# Patient Record
Sex: Female | Born: 1965 | Race: White | Hispanic: No | Marital: Married | Smoking: Never smoker
Health system: Southern US, Community
[De-identification: ages and names within clinical notes are randomized; demographics above are authoritative.]

## PROBLEM LIST (undated history)

## (undated) DIAGNOSIS — F32A Depression, unspecified: Secondary | ICD-10-CM

## (undated) DIAGNOSIS — Z8489 Family history of other specified conditions: Secondary | ICD-10-CM

## (undated) DIAGNOSIS — E119 Type 2 diabetes mellitus without complications: Secondary | ICD-10-CM

## (undated) DIAGNOSIS — I152 Hypertension secondary to endocrine disorders: Secondary | ICD-10-CM

## (undated) DIAGNOSIS — E039 Hypothyroidism, unspecified: Secondary | ICD-10-CM

## (undated) DIAGNOSIS — F329 Major depressive disorder, single episode, unspecified: Secondary | ICD-10-CM

## (undated) DIAGNOSIS — F419 Anxiety disorder, unspecified: Secondary | ICD-10-CM

## (undated) DIAGNOSIS — E785 Hyperlipidemia, unspecified: Secondary | ICD-10-CM

## (undated) DIAGNOSIS — C801 Malignant (primary) neoplasm, unspecified: Secondary | ICD-10-CM

## (undated) DIAGNOSIS — E079 Disorder of thyroid, unspecified: Secondary | ICD-10-CM

## (undated) DIAGNOSIS — T7840XA Allergy, unspecified, initial encounter: Secondary | ICD-10-CM

## (undated) DIAGNOSIS — E1159 Type 2 diabetes mellitus with other circulatory complications: Secondary | ICD-10-CM

## (undated) HISTORY — DX: Depression, unspecified: F32.A

## (undated) HISTORY — PX: WRIST SURGERY: SHX841

## (undated) HISTORY — PX: BREAST LUMPECTOMY: SHX2

## (undated) HISTORY — PX: BREAST EXCISIONAL BIOPSY: SUR124

## (undated) HISTORY — DX: Major depressive disorder, single episode, unspecified: F32.9

## (undated) HISTORY — DX: Malignant (primary) neoplasm, unspecified: C80.1

## (undated) HISTORY — DX: Anxiety disorder, unspecified: F41.9

## (undated) HISTORY — PX: FRACTURE SURGERY: SHX138

## (undated) HISTORY — PX: BREAST BIOPSY: SHX20

## (undated) HISTORY — DX: Allergy, unspecified, initial encounter: T78.40XA

## (undated) HISTORY — DX: Disorder of thyroid, unspecified: E07.9

## (undated) HISTORY — PX: TUBAL LIGATION: SHX77

---

## 1989-04-09 HISTORY — PX: WRIST FRACTURE SURGERY: SHX121

## 1990-04-09 HISTORY — PX: HARDWARE REMOVAL: SHX979

## 1999-10-10 ENCOUNTER — Emergency Department (HOSPITAL_COMMUNITY): Admission: EM | Admit: 1999-10-10 | Discharge: 1999-10-10 | Payer: Self-pay | Admitting: Emergency Medicine

## 2000-04-09 HISTORY — PX: APPENDECTOMY: SHX54

## 2000-04-09 HISTORY — PX: ABDOMINAL HYSTERECTOMY: SHX81

## 2000-05-25 ENCOUNTER — Emergency Department (HOSPITAL_COMMUNITY): Admission: EM | Admit: 2000-05-25 | Discharge: 2000-05-25 | Payer: Self-pay | Admitting: Emergency Medicine

## 2001-06-28 ENCOUNTER — Emergency Department (HOSPITAL_COMMUNITY): Admission: EM | Admit: 2001-06-28 | Discharge: 2001-06-28 | Payer: Self-pay | Admitting: *Deleted

## 2004-02-23 ENCOUNTER — Ambulatory Visit: Payer: Self-pay | Admitting: Physician Assistant

## 2004-02-29 ENCOUNTER — Ambulatory Visit: Payer: Self-pay | Admitting: Pain Medicine

## 2004-03-09 ENCOUNTER — Ambulatory Visit: Payer: Self-pay | Admitting: Physician Assistant

## 2004-04-09 DIAGNOSIS — D0512 Intraductal carcinoma in situ of left breast: Secondary | ICD-10-CM

## 2004-04-09 HISTORY — DX: Intraductal carcinoma in situ of left breast: D05.12

## 2004-04-09 HISTORY — PX: BREAST LUMPECTOMY: SHX2

## 2004-04-09 HISTORY — PX: BREAST BIOPSY: SHX20

## 2004-05-09 ENCOUNTER — Ambulatory Visit: Payer: Self-pay | Admitting: Pain Medicine

## 2004-05-22 ENCOUNTER — Ambulatory Visit: Payer: Self-pay | Admitting: Physician Assistant

## 2004-05-25 ENCOUNTER — Ambulatory Visit: Payer: Self-pay | Admitting: Pain Medicine

## 2004-06-08 ENCOUNTER — Ambulatory Visit: Payer: Self-pay | Admitting: Pain Medicine

## 2004-06-20 ENCOUNTER — Ambulatory Visit: Payer: Self-pay | Admitting: Physician Assistant

## 2004-10-13 ENCOUNTER — Ambulatory Visit: Payer: Self-pay | Admitting: Physician Assistant

## 2004-10-17 ENCOUNTER — Ambulatory Visit: Payer: Self-pay | Admitting: Physician Assistant

## 2004-12-26 ENCOUNTER — Ambulatory Visit: Payer: Self-pay | Admitting: Pain Medicine

## 2005-01-09 ENCOUNTER — Ambulatory Visit: Payer: Self-pay | Admitting: Physician Assistant

## 2005-02-15 ENCOUNTER — Ambulatory Visit: Payer: Self-pay | Admitting: Physician Assistant

## 2005-06-11 ENCOUNTER — Ambulatory Visit: Payer: Self-pay | Admitting: Physician Assistant

## 2005-06-12 ENCOUNTER — Ambulatory Visit: Payer: Self-pay | Admitting: Pain Medicine

## 2005-06-13 ENCOUNTER — Emergency Department: Payer: Self-pay | Admitting: Emergency Medicine

## 2005-06-15 ENCOUNTER — Emergency Department: Payer: Self-pay | Admitting: Internal Medicine

## 2005-06-25 ENCOUNTER — Emergency Department: Payer: Self-pay | Admitting: Unknown Physician Specialty

## 2005-06-29 ENCOUNTER — Ambulatory Visit: Payer: Self-pay | Admitting: Physician Assistant

## 2005-07-05 ENCOUNTER — Ambulatory Visit: Payer: Self-pay | Admitting: Pain Medicine

## 2005-08-30 ENCOUNTER — Ambulatory Visit: Payer: Self-pay | Admitting: Unknown Physician Specialty

## 2005-09-12 ENCOUNTER — Ambulatory Visit: Payer: Self-pay | Admitting: Unknown Physician Specialty

## 2005-10-22 ENCOUNTER — Ambulatory Visit: Payer: Self-pay | Admitting: Physician Assistant

## 2006-01-17 ENCOUNTER — Ambulatory Visit: Payer: Self-pay | Admitting: Physician Assistant

## 2006-02-21 ENCOUNTER — Emergency Department: Payer: Self-pay | Admitting: Emergency Medicine

## 2006-04-16 ENCOUNTER — Ambulatory Visit: Payer: Self-pay | Admitting: Physician Assistant

## 2006-05-19 ENCOUNTER — Emergency Department: Payer: Self-pay | Admitting: Emergency Medicine

## 2006-07-15 ENCOUNTER — Ambulatory Visit: Payer: Self-pay | Admitting: Physician Assistant

## 2006-08-01 ENCOUNTER — Ambulatory Visit: Payer: Self-pay | Admitting: Pain Medicine

## 2006-10-02 ENCOUNTER — Ambulatory Visit: Payer: Self-pay | Admitting: Physician Assistant

## 2007-01-09 ENCOUNTER — Ambulatory Visit: Payer: Self-pay | Admitting: Physician Assistant

## 2007-04-23 ENCOUNTER — Ambulatory Visit: Payer: Self-pay | Admitting: Physician Assistant

## 2007-04-24 ENCOUNTER — Ambulatory Visit: Payer: Self-pay | Admitting: Pain Medicine

## 2007-05-21 ENCOUNTER — Ambulatory Visit: Payer: Self-pay | Admitting: Physician Assistant

## 2007-10-14 ENCOUNTER — Ambulatory Visit: Payer: Self-pay | Admitting: Pain Medicine

## 2008-01-15 ENCOUNTER — Ambulatory Visit: Payer: Self-pay | Admitting: Pain Medicine

## 2014-03-21 ENCOUNTER — Emergency Department: Payer: Self-pay | Admitting: Emergency Medicine

## 2014-10-14 ENCOUNTER — Encounter: Payer: Self-pay | Admitting: Family Medicine

## 2015-02-16 ENCOUNTER — Encounter: Payer: Self-pay | Admitting: Family Medicine

## 2015-02-16 ENCOUNTER — Ambulatory Visit (INDEPENDENT_AMBULATORY_CARE_PROVIDER_SITE_OTHER): Payer: BLUE CROSS/BLUE SHIELD | Admitting: Family Medicine

## 2015-02-16 VITALS — BP 118/76 | HR 82 | Temp 98.6°F | Resp 16 | Ht 64.5 in | Wt 194.6 lb

## 2015-02-16 DIAGNOSIS — Z8639 Personal history of other endocrine, nutritional and metabolic disease: Secondary | ICD-10-CM | POA: Insufficient documentation

## 2015-02-16 DIAGNOSIS — M7701 Medial epicondylitis, right elbow: Secondary | ICD-10-CM | POA: Diagnosis not present

## 2015-02-16 DIAGNOSIS — Z13220 Encounter for screening for lipoid disorders: Secondary | ICD-10-CM

## 2015-02-16 DIAGNOSIS — F32A Depression, unspecified: Secondary | ICD-10-CM | POA: Insufficient documentation

## 2015-02-16 DIAGNOSIS — F410 Panic disorder [episodic paroxysmal anxiety] without agoraphobia: Secondary | ICD-10-CM | POA: Insufficient documentation

## 2015-02-16 DIAGNOSIS — Z131 Encounter for screening for diabetes mellitus: Secondary | ICD-10-CM | POA: Diagnosis not present

## 2015-02-16 DIAGNOSIS — F329 Major depressive disorder, single episode, unspecified: Secondary | ICD-10-CM | POA: Insufficient documentation

## 2015-02-16 NOTE — Progress Notes (Signed)
Subjective:     Patient ID: Virginia Martin, female   DOB: [birth year], 49 y.o.   MRN: [id number]  HPI  Chief Complaint  Patient presents with  . New Patient (Initial Visit)    Patient comes into office today to establish patient care, she states she did want to address pain that she has had in her right elbow for the past couple months.   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 Has taken ibuprofen intermittently but not on a regular basis for her sx. Does not wish a flu shot as family members have gotten sick afterwards.   Review of Systems  Constitutional:       States she has not had screening labs in  > one year.  Genitourinary:       States she has not had recent f/u with [facility] regarding her DCIS.Encouraged to do so.       Objective:   Physical Exam  Constitutional: She appears well-developed and well-nourished. No distress.  Cardiovascular: Normal rate and regular rhythm.   Pulmonary/Chest: Breath sounds normal.  Musculoskeletal:  Right M.S.:EF/EE/WR/WE 5/5. Tender just distal to her medial epicondyle. FROM       Assessment:    1. Epicondylitis elbow, medial, right: patient defers orthopedic referral  2. Screening for cholesterol level - Lipid panel  3. Screening for diabetes mellitus - Renal function panel    Plan:    Discussed use of ibuprofen 400 mg. 3 x day with food and cold compresses.

## 2015-02-16 NOTE — Patient Instructions (Signed)
Discussed scheduling Two  Aleve twice daily with food and use of cold compress for 20 minutes at the end of your work day. We will call you with results of your lab tests.

## 2015-02-17 ENCOUNTER — Telehealth: Payer: Self-pay

## 2015-02-17 LAB — LIPID PANEL
Chol/HDL Ratio: 4.3 ratio units (ref 0.0–4.4)
Cholesterol, Total: 235 mg/dL — ABNORMAL HIGH (ref 100–199)
HDL: 55 mg/dL (ref >39)
LDL Calculated: 136 mg/dL — ABNORMAL HIGH (ref 0–99)
Triglycerides: 218 mg/dL — ABNORMAL HIGH (ref 0–149)
VLDL Cholesterol Cal: 44 mg/dL — ABNORMAL HIGH (ref 5–40)

## 2015-02-17 LAB — RENAL FUNCTION PANEL
ALBUMIN: 4.5 g/dL (ref 3.5–5.5)
BUN/Creatinine Ratio: 19 (ref 9–23)
BUN: 15 mg/dL (ref 6–24)
CHLORIDE: 100 mmol/L (ref 97–106)
CO2: 27 mmol/L (ref 18–29)
Calcium: 9.8 mg/dL (ref 8.7–10.2)
Creatinine, Ser: 0.8 mg/dL (ref 0.57–1.00)
GFR calc Af Amer: 101 mL/min/{1.73_m2} (ref >59)
GFR calc non Af Amer: 87 mL/min/{1.73_m2} (ref >59)
GLUCOSE: 99 mg/dL (ref 65–99)
PHOSPHORUS: 3.6 mg/dL (ref 2.5–4.5)
POTASSIUM: 5.2 mmol/L (ref 3.5–5.2)
Sodium: 141 mmol/L (ref 136–144)

## 2015-02-17 NOTE — Telephone Encounter (Signed)
-----   Message from Carmon Ginsberg, [place] sent at 02/17/2015  7:58 AM EST ----- Labs ok with mildly elevated cholesterol. Your calculated 10 year risk of developing cardiovascular disease is low at 1.1% at this time.

## 2015-02-17 NOTE — Telephone Encounter (Signed)
Patient has been advised, KW 

## 2015-02-18 ENCOUNTER — Other Ambulatory Visit: Payer: Self-pay

## 2015-03-01 ENCOUNTER — Other Ambulatory Visit: Payer: Self-pay | Admitting: Family Medicine

## 2015-03-01 DIAGNOSIS — F32A Depression, unspecified: Secondary | ICD-10-CM

## 2015-03-01 DIAGNOSIS — F329 Major depressive disorder, single episode, unspecified: Secondary | ICD-10-CM

## 2015-03-01 MED ORDER — LORAZEPAM 1 MG PO TABS: ORAL_TABLET | ORAL | Status: DC

## 2015-03-01 MED ORDER — VENLAFAXINE HCL ER 150 MG PO CP24: 150.0000 mg | ORAL_CAPSULE | Freq: Every day | ORAL | Status: DC

## 2015-03-01 NOTE — Telephone Encounter (Signed)
Patient states that she re-established with you as a patient and that she was previously going to [facility] who prescribed her medication, she is requesting that since you are now her PCP if you would refill her scripts? KW

## 2015-03-01 NOTE — Telephone Encounter (Signed)
Pt request to speak with [name] about her medications. Thanks TNP

## 2015-04-12 ENCOUNTER — Other Ambulatory Visit: Payer: Self-pay | Admitting: Family Medicine

## 2015-04-19 ENCOUNTER — Other Ambulatory Visit: Payer: Self-pay | Admitting: Family Medicine

## 2015-04-19 ENCOUNTER — Telehealth: Payer: Self-pay | Admitting: Family Medicine

## 2015-04-19 NOTE — Telephone Encounter (Signed)
Called in Rx as below. Patient notified.

## 2015-04-19 NOTE — Telephone Encounter (Signed)
Please call in lorazepam as ordered in the EMR on 04/12/15

## 2015-04-19 NOTE — Telephone Encounter (Signed)
Pt called for refill on LORazepam (ATIVAN) 1 MG tablet. It looks like it was printed on 04/12/15 but pt stated she hasn't picked up the RX and I didn't see it up front. Thanks TNP

## 2015-04-19 NOTE — Telephone Encounter (Signed)
Called [facility] and Joe (pharmacist) reports that patient last picked up Lorazepam on 03/13/15. They have no record of patient filling med in January.

## 2015-04-20 NOTE — Telephone Encounter (Signed)
RX called in at [facility]  

## 2015-05-16 ENCOUNTER — Ambulatory Visit (INDEPENDENT_AMBULATORY_CARE_PROVIDER_SITE_OTHER): Payer: BLUE CROSS/BLUE SHIELD | Admitting: Family Medicine

## 2015-05-16 ENCOUNTER — Encounter: Payer: Self-pay | Admitting: Family Medicine

## 2015-05-16 VITALS — BP 122/82 | HR 81 | Temp 97.7°F | Resp 16 | Wt 199.6 lb

## 2015-05-16 DIAGNOSIS — L03011 Cellulitis of right finger: Secondary | ICD-10-CM | POA: Diagnosis not present

## 2015-05-16 MED ORDER — CEFDINIR 300 MG PO CAPS: 300.0000 mg | ORAL_CAPSULE | Freq: Two times a day (BID) | ORAL | Status: DC

## 2015-05-16 NOTE — Progress Notes (Signed)
Subjective:     Patient ID: Virginia Martin, female   DOB: 1965/09/04, 50 y.o.   MRN: [id number]  HPI  Chief Complaint  Patient presents with  . Nail Problem    Patient comes in office today concerned about a possible nail infection on her right thumb. Patient reports that middle of last week around the 1st she had pulled off a piece of skin next to her nail. Since pulling skin off patient reports redness and drainage around nail, she has applied Neosporion and soaked in Epison salt.   Defers Keflex due to a memory of "bad taste". States throbbing less since using salt water soaks.   Review of Systems     Objective:   Physical Exam  Constitutional: She appears well-developed and well-nourished. No distress.  Skin:  Right thumb with inflammation at base and side of her nail. Mild tenderness to the touch. No drainage. Granulation tissue evident but no pointing.       Assessment:    1. Paronychia of right thumb - cefdinir (OMNICEF) 300 MG capsule; Take 1 capsule (300 mg total) by mouth 2 (two) times daily.  Dispense: 14 capsule; Refill: 0    Plan:    Continue salt water soaks.

## 2015-05-16 NOTE — Patient Instructions (Signed)
Continue Epsom salt soaks in warm water twice daily.

## 2015-07-01 ENCOUNTER — Ambulatory Visit: Payer: BLUE CROSS/BLUE SHIELD | Admitting: Family Medicine

## 2015-07-20 ENCOUNTER — Encounter: Payer: Self-pay | Admitting: *Deleted

## 2015-07-20 ENCOUNTER — Emergency Department
Admission: EM | Admit: 2015-07-20 | Discharge: 2015-07-20 | Disposition: A | Discharge status: Home / Self Care | Payer: BLUE CROSS/BLUE SHIELD | Attending: Emergency Medicine | Admitting: Emergency Medicine

## 2015-07-20 DIAGNOSIS — Z79899 Other long term (current) drug therapy: Secondary | ICD-10-CM | POA: Diagnosis not present

## 2015-07-20 DIAGNOSIS — F329 Major depressive disorder, single episode, unspecified: Secondary | ICD-10-CM | POA: Diagnosis not present

## 2015-07-20 DIAGNOSIS — L0231 Cutaneous abscess of buttock: Secondary | ICD-10-CM | POA: Diagnosis not present

## 2015-07-20 MED ORDER — SULFAMETHOXAZOLE-TRIMETHOPRIM 800-160 MG PO TABS: 1.0000 | ORAL_TABLET | Freq: Two times a day (BID) | ORAL | Status: DC

## 2015-07-20 MED ORDER — HYDROCODONE-ACETAMINOPHEN 5-325 MG PO TABS: 1.0000 | ORAL_TABLET | Freq: Four times a day (QID) | ORAL | Status: DC | PRN

## 2015-07-20 NOTE — ED Provider Notes (Signed)
[facility] Emergency Department Provider Note  ____________________________________________  Time seen: Approximately 10:20 PM  I have reviewed the triage vital signs and the nursing notes.   HISTORY  Chief Complaint Abscess    HPI Virginia Martin is a 50 y.o. female , NAD, presents to the emergency department with five-day history of right buttock pain and skin sore. States she was sitting in her recliner at home approximately 5 days ago with her gown on. States she felt a stinging on the right buttocks, shifted her seated position and continued to feel pain. Has felt increasing pain and wants to the buttocks since that time. Denies any open wounds or lesions. Has not noted any oozing or weeping. Denies fever, chills, body aches, nausea, vomiting, headaches, abdominal pain, changes in urinary or bowel habits. Has been icing the area without relief.   Past Medical History  Diagnosis Date  . Depression   . Cancer ([place])   . Thyroid disease     Patient Active Problem List   Diagnosis Date Noted  . Depression 02/16/2015  . Panic attacks 02/16/2015  . H/O: hypothyroidism 02/16/2015    Past Surgical History  Procedure Laterality Date  . Appendectomy  2002  . Breast biopsy  2006  . Abdominal hysterectomy  2002  . Wrist surgery Left 1991-1993    Current Outpatient Rx  Name  Route  Sig  Dispense  Refill  . cefdinir (OMNICEF) 300 MG capsule   Oral   Take 1 capsule (300 mg total) by mouth 2 (two) times daily.   14 capsule   0   . HYDROcodone-acetaminophen (NORCO) 5-325 MG tablet   Oral   Take 1 tablet by mouth every 6 (six) hours as needed for severe pain.   6 tablet   0   . LORazepam (ATIVAN) 1 MG tablet      TAKE 1 TABLET BY MOUTH AT BEDTIME AND 1/2 TAB EVERY 6 HOURS AS NEEDED FOR PANIC ATTACK   35 tablet   5     Not to exceed 5 additional fills before 09/07/2015 ...   . sulfamethoxazole-trimethoprim (BACTRIM DS,SEPTRA DS) 800-160 MG tablet    Oral   Take 1 tablet by mouth 2 (two) times daily.   20 tablet   0   . venlafaxine XR (EFFEXOR XR) 150 MG 24 hr capsule   Oral   Take 1 capsule (150 mg total) by mouth daily with breakfast.   30 capsule   5     Allergies Clindamycin hcl; Codeine; Amoxicillin; Diphenhydramine hcl; and Gabapentin  Family History  Problem Relation Age of Onset  . Depression Mother   . Colon polyps Mother   . Heart attack Father   . Hypertension Father   . Diabetes Father   . Depression Sister   . Diabetes Maternal Grandmother   . Cancer Maternal Grandmother   . COPD Paternal Grandmother     Social History Social History  Substance Use Topics  . Smoking status: Never Smoker   . Smokeless tobacco: Never Used  . Alcohol Use: No     Review of Systems  Constitutional: No fever/chills, Fatigue Eyes: No visual changes.  Cardiovascular: No chest pain. Respiratory:  No shortness of breath.  Gastrointestinal: No abdominal pain.  No nausea, vomiting.  No diarrhea, constipation. Genitourinary: Negative for dysuria, hematuria.  Musculoskeletal: Negative for back pain.  Skin: Positive skin sore right buttock. Negative for rash, oozing, weeping. Neurological: Negative for headaches, focal weakness or numbness. No  saddle paresthesias. 10-point ROS otherwise negative.  ____________________________________________   PHYSICAL EXAM:  VITAL SIGNS: ED Triage Vitals  Enc Vitals Group     BP 07/20/15 2121 144/82 mmHg     Pulse Rate 07/20/15 2121 86     Resp 07/20/15 2121 18     Temp 07/20/15 2121 98.5 F (36.9 C)     Temp Source 07/20/15 2121 Oral     SpO2 07/20/15 2121 97 %     Weight 07/20/15 2121 197 lb (89.359 kg)     Height 07/20/15 2121 5\' 5"  (1.651 m)     Head Cir --      Peak Flow --      Pain Score 07/20/15 2125 8     Pain Loc --      Pain Edu? --      Excl. in [place]? --     Constitutional: Alert and oriented. Well appearing and in no acute distress. Eyes: Conjunctivae are  normal.  Head: Atraumatic. Cardiovascular:  Good peripheral circulation. Respiratory: Normal respiratory effort without tachypnea or retractions.  Neurologic:  Normal speech and language. No gross focal neurologic deficits are appreciated.  Skin:  4 cm alar area of erythema proximal about the right buttock. Central punctate lesion is noted with a 0.5 cm x 0.5 cm oblong area of significant induration. No area of fluctuance is noted. No active oozing or weeping. Skin is warm and tender to palpation. Skin is warm, dry and intact. No rash noted. Psychiatric: Mood and affect are normal. Speech and behavior are normal. Patient exhibits appropriate insight and judgement.   ____________________________________________   LABS  None  ____________________________________________  EKG  None ____________________________________________  RADIOLOGY  None ____________________________________________    PROCEDURES  Procedure(s) performed: None    Medications - No data to display   ____________________________________________   INITIAL IMPRESSION / ASSESSMENT AND PLAN / ED COURSE  Patient's diagnosis is consistent with abscess of right buttock. Potential for spider bite in this area, therefore antibiotics will be chosen to cover for such. No area of fluctuance noted for incision and drainage to be successful. Patient is advised to apply warm compresses to the area 20 minutes 3-4 times daily to promote fluctuance for incision and drainage at a later date and time. Patient will be discharged home with prescriptions for Bactrim DS and Norco to take as directed. Patient is to follow up with her primary care provider or [facility] if symptoms persist past this treatment course. Patient is given ED precautions to return to the ED for any worsening or new symptoms.    ____________________________________________  FINAL CLINICAL IMPRESSION(S) / ED DIAGNOSES  Final diagnoses:   Abscess of right buttock      NEW MEDICATIONS STARTED DURING THIS VISIT:  Discharge Medication List as of 07/20/2015 10:22 PM    START taking these medications   Details  HYDROcodone-acetaminophen (NORCO) 5-325 MG tablet Take 1 tablet by mouth every 6 (six) hours as needed for severe pain., Starting 07/20/2015, Until Discontinued, Print    sulfamethoxazole-trimethoprim (BACTRIM DS,SEPTRA DS) 800-160 MG tablet Take 1 tablet by mouth 2 (two) times daily., Starting 07/20/2015, Until Discontinued, Print             Braxton Feathers, PA-C 07/20/15 2236  Lisa Roca, MD 07/20/15 2348

## 2015-07-20 NOTE — ED Notes (Addendum)
Pt states "I have something going on wit my butt cheek" States R side. Pt states pain began Friday, felt sting. States painful, warm, hard, red. Pt laying more on left side than back. Pt states laying on ice pack at night.

## 2015-07-20 NOTE — Discharge Instructions (Signed)

## 2015-07-20 NOTE — ED Notes (Signed)
Pt has abscess on right buttock.  Sx for 5 days.  No drainage.

## 2015-07-21 ENCOUNTER — Encounter: Payer: Self-pay | Admitting: Urgent Care

## 2015-07-21 DIAGNOSIS — L03317 Cellulitis of buttock: Secondary | ICD-10-CM | POA: Diagnosis not present

## 2015-07-21 DIAGNOSIS — E039 Hypothyroidism, unspecified: Secondary | ICD-10-CM | POA: Insufficient documentation

## 2015-07-21 DIAGNOSIS — F329 Major depressive disorder, single episode, unspecified: Secondary | ICD-10-CM | POA: Insufficient documentation

## 2015-07-21 DIAGNOSIS — L0231 Cutaneous abscess of buttock: Secondary | ICD-10-CM | POA: Diagnosis present

## 2015-07-21 NOTE — ED Notes (Signed)
Patient presents with abscess to her buttocks - seen her last night by [place], PA-C for the same. Patient sen home on Norco and Bactrim at around [place] - reports that she has taken 4 doses of the ABX and the Norco is "not helping at all". Patient request I&D of area - states, "She didn't even hardly touch me. I want it opened. I am not here for drugs. I am here for relief". Area just started draining "some stuff" just PTA.

## 2015-07-22 ENCOUNTER — Emergency Department: Payer: BLUE CROSS/BLUE SHIELD

## 2015-07-22 ENCOUNTER — Encounter: Payer: Self-pay | Admitting: Radiology

## 2015-07-22 ENCOUNTER — Emergency Department
Admission: EM | Admit: 2015-07-22 | Discharge: 2015-07-22 | Disposition: A | Discharge status: Home / Self Care | Payer: BLUE CROSS/BLUE SHIELD | Attending: Emergency Medicine | Admitting: Emergency Medicine

## 2015-07-22 DIAGNOSIS — L03317 Cellulitis of buttock: Secondary | ICD-10-CM

## 2015-07-22 LAB — CBC
HCT: 37.1 % (ref 35.0–47.0)
Hemoglobin: 12.5 g/dL (ref 12.0–16.0)
MCH: 29.7 pg (ref 26.0–34.0)
MCHC: 33.7 g/dL (ref 32.0–36.0)
MCV: 88.1 fL (ref 80.0–100.0)
PLATELETS: 274 10*3/uL (ref 150–440)
RBC: 4.21 MIL/uL (ref 3.80–5.20)
RDW: 13.4 % (ref 11.5–14.5)
WBC: 13.3 10*3/uL — AB (ref 3.6–11.0)

## 2015-07-22 LAB — COMPREHENSIVE METABOLIC PANEL
ALBUMIN: 4 g/dL (ref 3.5–5.0)
ALT: 34 U/L (ref 14–54)
AST: 32 U/L (ref 15–41)
Alkaline Phosphatase: 84 U/L (ref 38–126)
Anion gap: 8 (ref 5–15)
BUN: 16 mg/dL (ref 6–20)
CALCIUM: 8.8 mg/dL — AB (ref 8.9–10.3)
CHLORIDE: 103 mmol/L (ref 101–111)
CO2: 24 mmol/L (ref 22–32)
CREATININE: 0.79 mg/dL (ref 0.44–1.00)
GFR calc non Af Amer: >60 mL/min (ref >60)
Glucose, Bld: 94 mg/dL (ref 65–99)
Potassium: 3.6 mmol/L (ref 3.5–5.1)
Sodium: 135 mmol/L (ref 135–145)
TOTAL PROTEIN: 7.7 g/dL (ref 6.5–8.1)
Total Bilirubin: 0.3 mg/dL (ref 0.3–1.2)

## 2015-07-22 MED ORDER — OXYCODONE-ACETAMINOPHEN 10-325 MG PO TABS: 1.0000 | ORAL_TABLET | ORAL | Status: DC | PRN

## 2015-07-22 MED ORDER — CEPHALEXIN 500 MG PO CAPS
500.0000 mg | ORAL_CAPSULE | Freq: Once | ORAL | Status: AC
  Administered 2015-07-22: 500 mg via ORAL
  Filled 2015-07-22: qty 1

## 2015-07-22 MED ORDER — IOPAMIDOL (ISOVUE-300) INJECTION 61%
100.0000 mL | Freq: Once | INTRAVENOUS | Status: AC | PRN
  Administered 2015-07-22: 100 mL via INTRAVENOUS

## 2015-07-22 MED ORDER — ONDANSETRON HCL 4 MG/2ML IJ SOLN
4.0000 mg | Freq: Once | INTRAMUSCULAR | Status: AC
  Administered 2015-07-22: 4 mg via INTRAVENOUS
  Filled 2015-07-22: qty 2

## 2015-07-22 MED ORDER — MORPHINE SULFATE (PF) 4 MG/ML IV SOLN
INTRAVENOUS | Status: AC
  Filled 2015-07-22: qty 1

## 2015-07-22 MED ORDER — MORPHINE SULFATE (PF) 4 MG/ML IV SOLN
4.0000 mg | Freq: Once | INTRAVENOUS | Status: AC
  Administered 2015-07-22: 4 mg via INTRAVENOUS
  Filled 2015-07-22: qty 1

## 2015-07-22 MED ORDER — CEPHALEXIN 500 MG PO CAPS: 500.0000 mg | ORAL_CAPSULE | Freq: Two times a day (BID) | ORAL | Status: DC

## 2015-07-22 MED ORDER — MORPHINE SULFATE (PF) 4 MG/ML IV SOLN
4.0000 mg | Freq: Once | INTRAVENOUS | Status: AC
  Administered 2015-07-22: 4 mg via INTRAVENOUS

## 2015-07-22 MED ORDER — DIATRIZOATE MEGLUMINE & SODIUM 66-10 % PO SOLN
15.0000 mL | ORAL | Status: AC
  Administered 2015-07-22 (×2): 15 mL via ORAL
  Filled 2015-07-22 (×2): qty 30

## 2015-07-22 NOTE — Discharge Instructions (Signed)
Cellulitis Please return to the emergency department in 24 hours for repeat evaluation Cellulitis is an infection of the skin and the tissue beneath it. The infected area is usually red and tender. Cellulitis occurs most often in the arms and lower legs.  CAUSES  Cellulitis is caused by bacteria that enter the skin through cracks or cuts in the skin. The most common types of bacteria that cause cellulitis are staphylococci and streptococci. SIGNS AND SYMPTOMS   Redness and warmth.  Swelling.  Tenderness or pain.  Fever. DIAGNOSIS  Your health care provider can usually determine what is wrong based on a physical exam. Blood tests may also be done. TREATMENT  Treatment usually involves taking an antibiotic medicine. HOME CARE INSTRUCTIONS   Take your antibiotic medicine as directed by your health care provider. Finish the antibiotic even if you start to feel better.  Keep the infected arm or leg elevated to reduce swelling.  Apply a warm cloth to the affected area up to 4 times per day to relieve pain.  Take medicines only as directed by your health care provider.  Keep all follow-up visits as directed by your health care provider. SEEK MEDICAL CARE IF:   You notice red streaks coming from the infected area.  Your red area gets larger or turns dark in color.  Your bone or joint underneath the infected area becomes painful after the skin has healed.  Your infection returns in the same area or another area.  You notice a swollen bump in the infected area.  You develop new symptoms.  You have a fever. SEEK IMMEDIATE MEDICAL CARE IF:   You feel very sleepy.  You develop vomiting or diarrhea.  You have a general ill feeling (malaise) with muscle aches and pains.   This information is not intended to replace advice given to you by your health care provider. Make sure you discuss any questions you have with your health care provider.   Document Released: 01/03/2005  Document Revised: 12/15/2014 Document Reviewed: 06/11/2011 Elsevier Interactive Patient Education [employer].

## 2015-07-22 NOTE — ED Provider Notes (Signed)
[facility] Emergency Department Provider Note  ____________________________________________  Time seen:   I have reviewed the triage vital signs and the nursing notes.   HISTORY  Chief Complaint Abscess     HPI Virginia Martin is a 50 y.o. female presents with "right buttock abscess 5 days. Patient was seen in the emergency department yesterday for same and prescribed Bactrim at home no I&D was performed. Patient states the areas increase in size considerably since her visit yesterday in addition patient admits that her pain has worsened considerably as well. Current pain score is 9 out of 10 unrelieved with home prescription of Norco which was prescribed yesterday.. Patient denies any fever afebrile on presentation temperature 98.5.    Past Medical History  Diagnosis Date  . Depression   . Cancer ([place])   . Thyroid disease     Patient Active Problem List   Diagnosis Date Noted  . Depression 02/16/2015  . Panic attacks 02/16/2015  . H/O: hypothyroidism 02/16/2015    Past Surgical History  Procedure Laterality Date  . Appendectomy  2002  . Breast biopsy  2006  . Abdominal hysterectomy  2002  . Wrist surgery Left 1991-1993    Current Outpatient Rx  Name  Route  Sig  Dispense  Refill  . cefdinir (OMNICEF) 300 MG capsule   Oral   Take 1 capsule (300 mg total) by mouth 2 (two) times daily.   14 capsule   0   . HYDROcodone-acetaminophen (NORCO) 5-325 MG tablet   Oral   Take 1 tablet by mouth every 6 (six) hours as needed for severe pain.   6 tablet   0   . LORazepam (ATIVAN) 1 MG tablet      TAKE 1 TABLET BY MOUTH AT BEDTIME AND 1/2 TAB EVERY 6 HOURS AS NEEDED FOR PANIC ATTACK   35 tablet   5     Not to exceed 5 additional fills before 09/07/2015 ...   . sulfamethoxazole-trimethoprim (BACTRIM DS,SEPTRA DS) 800-160 MG tablet   Oral   Take 1 tablet by mouth 2 (two) times daily.   20 tablet   0   . venlafaxine XR (EFFEXOR XR) 150 MG  24 hr capsule   Oral   Take 1 capsule (150 mg total) by mouth daily with breakfast.   30 capsule   5     Allergies Clindamycin hcl; Codeine; Amoxicillin; Diphenhydramine hcl; and Gabapentin  Family History  Problem Relation Age of Onset  . Depression Mother   . Colon polyps Mother   . Heart attack Father   . Hypertension Father   . Diabetes Father   . Depression Sister   . Diabetes Maternal Grandmother   . Cancer Maternal Grandmother   . COPD Paternal Grandmother     Social History Social History  Substance Use Topics  . Smoking status: Never Smoker   . Smokeless tobacco: Never Used  . Alcohol Use: No    Review of Systems  Constitutional: Negative for fever. Eyes: Negative for visual changes. ENT: Negative for sore throat. Cardiovascular: Negative for chest pain. Respiratory: Negative for shortness of breath. Gastrointestinal: Negative for abdominal pain, vomiting and diarrhea. Genitourinary: Negative for dysuria. Musculoskeletal: Negative for back pain. Skin: Positive for right gluteal abscess Neurological: Negative for headaches, focal weakness or numbness.   10-point ROS otherwise negative.  ____________________________________________   PHYSICAL EXAM:  VITAL SIGNS: ED Triage Vitals  Enc Vitals Group     BP 07/21/15 2333 141/66 mmHg  Pulse Rate 07/21/15 2333 98     Resp 07/21/15 2333 18     Temp 07/21/15 2333 98.5 F (36.9 C)     Temp Source 07/21/15 2333 Oral     SpO2 07/21/15 2333 99 %     Weight 07/21/15 2333 197 lb (89.359 kg)     Height --      Head Cir --      Peak Flow --      Pain Score 07/21/15 2334 8     Pain Loc --      Pain Edu? --      Excl. in [place]? --      Constitutional: Alert and oriented. Well appearing and in no distress. Eyes: Conjunctivae are normal. PERRL. Normal extraocular movements. ENT   Head: Normocephalic and atraumatic.   Nose: No congestion/rhinnorhea.   Mouth/Throat: Mucous membranes are  moist.   Neck: No stridor. Hematological/Lymphatic/Immunilogical: No cervical lymphadenopathy. Cardiovascular: Normal rate, regular rhythm. Normal and symmetric distal pulses are present in all extremities. No murmurs, rubs, or gallops. Respiratory: Normal respiratory effort without tachypnea nor retractions. Breath sounds are clear and equal bilaterally. No wheezes/rales/rhonchi. Gastrointestinal: Soft and nontender. No distention. There is no CVA tenderness. Genitourinary: deferred Musculoskeletal: Nontender with normal range of motion in all extremities. No joint effusions.  No lower extremity tenderness nor edema. Neurologic:  Normal speech and language. No gross focal neurologic deficits are appreciated. Speech is normal.  Skin:  7 x 6 area of erythema and induration with flocculence noted right gluteal area. Psychiatric: Mood and affect are normal. Speech and behavior are normal. Patient exhibits appropriate insight and judgment.  ____________________________________________    LABS (pertinent positives/negatives)  Labs Reviewed  CBC - Abnormal; Notable for the following:    WBC 13.3 (*)    All other components within normal limits  COMPREHENSIVE METABOLIC PANEL - Abnormal; Notable for the following:    Calcium 8.8 (*)    All other components within normal limits      RADIOLOGY   CT Pelvis W Contrast (Final result) Result time: 07/22/15 06:05:50   Final result by Rad Results In Interface (07/22/15 06:05:50)   Narrative:   CLINICAL DATA: Buttock abscess  EXAM: CT PELVIS WITH CONTRAST  TECHNIQUE: Multidetector CT imaging of the pelvis was performed using the standard protocol following the bolus administration of intravenous contrast.  CONTRAST: 163mL ISOVUE-300 IOPAMIDOL (ISOVUE-300) INJECTION 61%  COMPARISON: None.  FINDINGS: There is edema and induration of the skin and subcutaneous tissues in the right buttock adjacent to the intergluteal cleft. A  drainable collection is not evident. No soft tissue gas. No extension of the inflammatory changes into the ischiorectal fossa or up into the pelvis. No bony abnormality.  Visible portions of the lower abdomen and pelvis are remarkable only for prior hysterectomy and a midline ventral hernia which is incompletely imaged.  IMPRESSION: Inflammatory stranding, subcutaneous edema and skin thickening in the right intergluteal cleft, without evidence of a drainable collection or soft tissue gas. The findings are most consistent with cellulitis.   Electronically Signed By: Andreas Newport M.D. On: 07/22/2015 06:05           INITIAL IMPRESSION / ASSESSMENT AND PLAN / ED COURSE  Pertinent labs & imaging results that were available during my care of the patient were reviewed by me and considered in my medical decision making (see chart for details).  CT scan revealed cellulitis with no drainable abscess. I informed the patient of this clinical finding  I encouraged the patient to return to the emergency department 24 hours for repeat exam. I instructed the patient's husband to return to the emergency department immediately if the patient develops a fever or redness extending beyond area which has been outlined by the nursing staff.  ____________________________________________   FINAL CLINICAL IMPRESSION(S) / ED DIAGNOSES  Final diagnoses:  Cellulitis, gluteal, right      Gregor Hams, MD 07/22/15 [phone]

## 2015-07-23 ENCOUNTER — Encounter: Payer: Self-pay | Admitting: Emergency Medicine

## 2015-07-23 DIAGNOSIS — Z79891 Long term (current) use of opiate analgesic: Secondary | ICD-10-CM | POA: Diagnosis not present

## 2015-07-23 DIAGNOSIS — F329 Major depressive disorder, single episode, unspecified: Secondary | ICD-10-CM | POA: Insufficient documentation

## 2015-07-23 DIAGNOSIS — E039 Hypothyroidism, unspecified: Secondary | ICD-10-CM | POA: Diagnosis not present

## 2015-07-23 DIAGNOSIS — K5900 Constipation, unspecified: Secondary | ICD-10-CM | POA: Diagnosis not present

## 2015-07-23 DIAGNOSIS — L03317 Cellulitis of buttock: Secondary | ICD-10-CM | POA: Diagnosis present

## 2015-07-23 DIAGNOSIS — F419 Anxiety disorder, unspecified: Secondary | ICD-10-CM | POA: Insufficient documentation

## 2015-07-23 DIAGNOSIS — Z818 Family history of other mental and behavioral disorders: Secondary | ICD-10-CM | POA: Insufficient documentation

## 2015-07-23 DIAGNOSIS — Z8371 Family history of colonic polyps: Secondary | ICD-10-CM | POA: Diagnosis not present

## 2015-07-23 DIAGNOSIS — Z833 Family history of diabetes mellitus: Secondary | ICD-10-CM | POA: Diagnosis not present

## 2015-07-23 DIAGNOSIS — Z880 Allergy status to penicillin: Secondary | ICD-10-CM | POA: Insufficient documentation

## 2015-07-23 DIAGNOSIS — Z79899 Other long term (current) drug therapy: Secondary | ICD-10-CM | POA: Diagnosis not present

## 2015-07-23 DIAGNOSIS — Z8249 Family history of ischemic heart disease and other diseases of the circulatory system: Secondary | ICD-10-CM | POA: Diagnosis not present

## 2015-07-23 DIAGNOSIS — L0231 Cutaneous abscess of buttock: Secondary | ICD-10-CM | POA: Diagnosis not present

## 2015-07-23 DIAGNOSIS — B9561 Methicillin susceptible Staphylococcus aureus infection as the cause of diseases classified elsewhere: Secondary | ICD-10-CM | POA: Insufficient documentation

## 2015-07-23 DIAGNOSIS — Z825 Family history of asthma and other chronic lower respiratory diseases: Secondary | ICD-10-CM | POA: Diagnosis not present

## 2015-07-23 DIAGNOSIS — E871 Hypo-osmolality and hyponatremia: Secondary | ICD-10-CM | POA: Insufficient documentation

## 2015-07-23 DIAGNOSIS — Z888 Allergy status to other drugs, medicaments and biological substances status: Secondary | ICD-10-CM | POA: Diagnosis not present

## 2015-07-23 NOTE — ED Notes (Signed)
Currently on 2 antibiotics at home for cellulitis; seen in this ED Wednesday and Thursday. Pt states she felt nauseated and hot today but was unable to check her temp at home so she came to be checked. Unsure if reddened area has changed but pt reports area is painful and draining. Pt was told if she had any concerns she should come have it checked to ensure area is healing well.

## 2015-07-24 ENCOUNTER — Encounter: Payer: Self-pay | Admitting: Internal Medicine

## 2015-07-24 ENCOUNTER — Observation Stay
Admission: EM | Admit: 2015-07-24 | Discharge: 2015-07-26 | Disposition: A | Discharge status: Home / Self Care | Payer: BLUE CROSS/BLUE SHIELD | Attending: Internal Medicine | Admitting: Internal Medicine

## 2015-07-24 DIAGNOSIS — L0390 Cellulitis, unspecified: Secondary | ICD-10-CM | POA: Diagnosis present

## 2015-07-24 DIAGNOSIS — L03317 Cellulitis of buttock: Secondary | ICD-10-CM | POA: Diagnosis not present

## 2015-07-24 LAB — CBC WITH DIFFERENTIAL/PLATELET
Basophils Absolute: 0.1 10*3/uL (ref 0–0.1)
Basophils Relative: 1 %
EOS PCT: 3 %
Eosinophils Absolute: 0.2 10*3/uL (ref 0–0.7)
HCT: 37.7 % (ref 35.0–47.0)
Hemoglobin: 12.8 g/dL (ref 12.0–16.0)
LYMPHS ABS: 1.7 10*3/uL (ref 1.0–3.6)
LYMPHS PCT: 26 %
MCH: 29.9 pg (ref 26.0–34.0)
MCHC: 33.9 g/dL (ref 32.0–36.0)
MCV: 88.3 fL (ref 80.0–100.0)
MONO ABS: 0.7 10*3/uL (ref 0.2–0.9)
MONOS PCT: 12 %
Neutro Abs: 3.8 10*3/uL (ref 1.4–6.5)
Neutrophils Relative %: 58 %
PLATELETS: 283 10*3/uL (ref 150–440)
RBC: 4.27 MIL/uL (ref 3.80–5.20)
RDW: 13 % (ref 11.5–14.5)
WBC: 6.5 10*3/uL (ref 3.6–11.0)

## 2015-07-24 LAB — BASIC METABOLIC PANEL
Anion gap: 6 (ref 5–15)
BUN: 14 mg/dL (ref 6–20)
CO2: 27 mmol/L (ref 22–32)
Calcium: 8.7 mg/dL — ABNORMAL LOW (ref 8.9–10.3)
Chloride: 101 mmol/L (ref 101–111)
Creatinine, Ser: 0.93 mg/dL (ref 0.44–1.00)
GFR calc Af Amer: >60 mL/min (ref >60)
GLUCOSE: 97 mg/dL (ref 65–99)
POTASSIUM: 3.8 mmol/L (ref 3.5–5.1)
Sodium: 134 mmol/L — ABNORMAL LOW (ref 135–145)

## 2015-07-24 MED ORDER — ENOXAPARIN SODIUM 40 MG/0.4ML ~~LOC~~ SOLN
40.0000 mg | SUBCUTANEOUS | Status: DC
  Administered 2015-07-24 – 2015-07-25 (×2): 40 mg via SUBCUTANEOUS
  Filled 2015-07-24 (×2): qty 0.4

## 2015-07-24 MED ORDER — ONDANSETRON HCL 4 MG/2ML IJ SOLN
4.0000 mg | Freq: Once | INTRAMUSCULAR | Status: AC
  Administered 2015-07-24: 4 mg via INTRAVENOUS
  Filled 2015-07-24: qty 2

## 2015-07-24 MED ORDER — MORPHINE SULFATE (PF) 2 MG/ML IV SOLN
2.0000 mg | INTRAVENOUS | Status: DC | PRN
Start: 2015-07-24 — End: 2015-07-26
  Administered 2015-07-24 – 2015-07-26 (×5): 2 mg via INTRAVENOUS
  Filled 2015-07-24 (×7): qty 1

## 2015-07-24 MED ORDER — SODIUM CHLORIDE 0.9 % IV SOLN: 250.0000 mL | INTRAVENOUS | Status: DC | PRN

## 2015-07-24 MED ORDER — LORAZEPAM 1 MG PO TABS
1.0000 mg | ORAL_TABLET | Freq: Four times a day (QID) | ORAL | Status: DC | PRN
  Administered 2015-07-24 – 2015-07-25 (×3): 1 mg via ORAL
  Filled 2015-07-24 (×3): qty 1

## 2015-07-24 MED ORDER — OXYCODONE HCL 5 MG PO TABS: 5.0000 mg | ORAL_TABLET | Freq: Four times a day (QID) | ORAL | Status: DC | PRN

## 2015-07-24 MED ORDER — OXYCODONE-ACETAMINOPHEN 5-325 MG PO TABS: 1.0000 | ORAL_TABLET | Freq: Four times a day (QID) | ORAL | Status: DC | PRN

## 2015-07-24 MED ORDER — ONDANSETRON HCL 4 MG PO TABS: 4.0000 mg | ORAL_TABLET | Freq: Four times a day (QID) | ORAL | Status: DC | PRN

## 2015-07-24 MED ORDER — VANCOMYCIN HCL IN DEXTROSE 1-5 GM/200ML-% IV SOLN
1000.0000 mg | Freq: Two times a day (BID) | INTRAVENOUS | Status: DC
  Administered 2015-07-24 – 2015-07-26 (×4): 1000 mg via INTRAVENOUS
  Filled 2015-07-24 (×5): qty 200

## 2015-07-24 MED ORDER — OXYCODONE-ACETAMINOPHEN 5-325 MG PO TABS
2.0000 | ORAL_TABLET | ORAL | Status: DC | PRN
  Administered 2015-07-24: 2 via ORAL
  Filled 2015-07-24: qty 2

## 2015-07-24 MED ORDER — SODIUM CHLORIDE 0.9 % IV BOLUS (SEPSIS)
1000.0000 mL | Freq: Once | INTRAVENOUS | Status: AC
  Administered 2015-07-24: 1000 mL via INTRAVENOUS

## 2015-07-24 MED ORDER — MORPHINE SULFATE (PF) 2 MG/ML IV SOLN
2.0000 mg | INTRAVENOUS | Status: DC | PRN
  Administered 2015-07-24: 2 mg via INTRAVENOUS
  Filled 2015-07-24: qty 1

## 2015-07-24 MED ORDER — OXYCODONE-ACETAMINOPHEN 10-325 MG PO TABS: 1.0000 | ORAL_TABLET | Freq: Four times a day (QID) | ORAL | Status: DC | PRN

## 2015-07-24 MED ORDER — MORPHINE SULFATE (PF) 4 MG/ML IV SOLN
4.0000 mg | Freq: Once | INTRAVENOUS | Status: AC
  Administered 2015-07-24: 4 mg via INTRAVENOUS
  Filled 2015-07-24: qty 1

## 2015-07-24 MED ORDER — DOXYCYCLINE HYCLATE 100 MG IV SOLR
100.0000 mg | Freq: Once | INTRAVENOUS | Status: AC
  Administered 2015-07-24: 100 mg via INTRAVENOUS
  Filled 2015-07-24: qty 100

## 2015-07-24 MED ORDER — ONDANSETRON HCL 4 MG/2ML IJ SOLN: 4.0000 mg | Freq: Four times a day (QID) | INTRAMUSCULAR | Status: DC | PRN

## 2015-07-24 MED ORDER — SODIUM CHLORIDE 0.9% FLUSH
3.0000 mL | Freq: Two times a day (BID) | INTRAVENOUS | Status: DC
  Administered 2015-07-24 – 2015-07-25 (×3): 3 mL via INTRAVENOUS

## 2015-07-24 MED ORDER — ACETAMINOPHEN 325 MG PO TABS: 650.0000 mg | ORAL_TABLET | Freq: Four times a day (QID) | ORAL | Status: DC | PRN

## 2015-07-24 MED ORDER — OXYCODONE HCL 5 MG PO TABS
5.0000 mg | ORAL_TABLET | Freq: Four times a day (QID) | ORAL | Status: DC | PRN
  Administered 2015-07-24: 5 mg via ORAL
  Filled 2015-07-24: qty 1

## 2015-07-24 MED ORDER — SODIUM CHLORIDE 0.9% FLUSH: 3.0000 mL | INTRAVENOUS | Status: DC | PRN

## 2015-07-24 MED ORDER — ACETAMINOPHEN 650 MG RE SUPP: 650.0000 mg | Freq: Four times a day (QID) | RECTAL | Status: DC | PRN

## 2015-07-24 MED ORDER — VENLAFAXINE HCL ER 75 MG PO CP24: 150.0000 mg | ORAL_CAPSULE | Freq: Every day | ORAL | Status: DC

## 2015-07-24 MED ORDER — OXYCODONE-ACETAMINOPHEN 5-325 MG PO TABS
1.0000 | ORAL_TABLET | ORAL | Status: DC | PRN
  Administered 2015-07-24 – 2015-07-26 (×3): 1 via ORAL
  Filled 2015-07-24 (×3): qty 1

## 2015-07-24 MED ORDER — VANCOMYCIN HCL IN DEXTROSE 1-5 GM/200ML-% IV SOLN: 1000.0000 mg | Freq: Once | INTRAVENOUS | Status: DC

## 2015-07-24 MED ORDER — VENLAFAXINE HCL ER 75 MG PO CP24
150.0000 mg | ORAL_CAPSULE | Freq: Every day | ORAL | Status: DC
  Administered 2015-07-24 – 2015-07-25 (×2): 150 mg via ORAL
  Filled 2015-07-24 (×2): qty 2

## 2015-07-24 MED ORDER — SENNOSIDES-DOCUSATE SODIUM 8.6-50 MG PO TABS: 1.0000 | ORAL_TABLET | Freq: Every evening | ORAL | Status: DC | PRN

## 2015-07-24 MED ORDER — OXYCODONE HCL 5 MG PO TABS
5.0000 mg | ORAL_TABLET | ORAL | Status: DC | PRN
  Administered 2015-07-24 – 2015-07-26 (×3): 5 mg via ORAL
  Filled 2015-07-24 (×3): qty 1

## 2015-07-24 MED ORDER — VENLAFAXINE HCL ER 75 MG PO CP24
150.0000 mg | ORAL_CAPSULE | Freq: Every day | ORAL | Status: DC
Start: 2015-07-25 — End: 2015-07-24

## 2015-07-24 MED ORDER — OXYCODONE-ACETAMINOPHEN 5-325 MG PO TABS: 1.0000 | ORAL_TABLET | ORAL | Status: DC | PRN

## 2015-07-24 NOTE — ED Notes (Signed)
Pt. Acuity changed from 5 to 3 for infection

## 2015-07-24 NOTE — Plan of Care (Signed)
Problem: Skin Integrity: Goal: Skin integrity will improve Outcome: Progressing Monitoring wound closely and barriers were marked to monitor growth

## 2015-07-24 NOTE — Progress Notes (Signed)
[facility] [place] NAME: Virginia Martin    MR#:  [id number]  DATE OF BIRTH:  [birth year]  SUBJECTIVE:   Patient is here due to right buttock cellulitis. Still having significant redness and pain in the area. Afebrile, white cell count is normal.  REVIEW OF SYSTEMS:    Review of Systems  Constitutional: Negative for fever and chills.  HENT: Negative for congestion and tinnitus.   Eyes: Negative for blurred vision and double vision.  Respiratory: Negative for cough, shortness of breath and wheezing.   Cardiovascular: Negative for chest pain, orthopnea and PND.  Gastrointestinal: Negative for nausea, vomiting, abdominal pain and diarrhea.  Genitourinary: Negative for dysuria and hematuria.  Neurological: Negative for dizziness, sensory change and focal weakness.  All other systems reviewed and are negative.   Nutrition: Regular Tolerating Diet: Yes Tolerating PT: Ambulatory   DRUG ALLERGIES:   Allergies  Allergen Reactions  . Clindamycin Hcl Diarrhea    Other Reaction: GI trouble-hospitalized  . Codeine Rash  . Amoxicillin Itching    Other reaction(s): Other (See Comments) Other Reaction: blisters  . Diphenhydramine Hcl Other (See Comments)    Other Reaction: racing heart  . Gabapentin Rash    Other Reaction: red skin    VITALS:  Blood pressure 123/70, pulse 64, temperature 97.9 F (36.6 C), temperature source Oral, resp. rate 16, height 5\' 5"  (1.651 m), weight 89.812 kg (198 lb), SpO2 98 %.  PHYSICAL EXAMINATION:   Physical Exam  GENERAL:  50 y.o.-year-old patient lying in the bed with no acute distress.  EYES: Pupils equal, round, reactive to light and accommodation. No scleral icterus. Extraocular muscles intact.  HEENT: Head atraumatic, normocephalic. Oropharynx and nasopharynx clear.  NECK:  Supple, no jugular venous distention. No thyroid enlargement, no tenderness.  LUNGS: Normal breath sounds bilaterally, no  wheezing, rales, rhonchi. No use of accessory muscles of respiration.  CARDIOVASCULAR: S1, S2 normal. No murmurs, rubs, or gallops.  ABDOMEN: Soft, nontender, nondistended. Bowel sounds present. No organomegaly or mass.  EXTREMITIES: No cyanosis, clubbing or edema b/l.    NEUROLOGIC: Cranial nerves II through XII are intact. No focal Motor or sensory deficits b/l.   PSYCHIATRIC: The patient is alert and oriented x 3.  SKIN: No obvious rash, lesion, right buttock cheek indurated area with redness surrounding. Small open area with minimal drainage. Consistent with cellulitis.   LABORATORY PANEL:   CBC  Recent Labs Lab 07/24/15 0433  WBC 6.5  HGB 12.8  HCT 37.7  PLT 283   ------------------------------------------------------------------------------------------------------------------  Chemistries   Recent Labs Lab 07/22/15 0302 07/24/15 0433  NA 135 134*  K 3.6 3.8  CL 103 101  CO2 24 27  GLUCOSE 94 97  BUN 16 14  CREATININE 0.79 0.93  CALCIUM 8.8* 8.7*  AST 32  --   ALT 34  --   ALKPHOS 84  --   BILITOT 0.3  --    ------------------------------------------------------------------------------------------------------------------  Cardiac Enzymes No results for input(s): TROPONINI in the last 168 hours. ------------------------------------------------------------------------------------------------------------------  RADIOLOGY:  No results found.   ASSESSMENT AND PLAN:   50 year old female with past medical history of anxiety, depression, hypothyroidism who presents to the hospital due to right buttock redness swelling and pain.  1. Right buttock cellulitis with possible abscess-patient has failed oral antibiotic therapy and therefore admitted for IV antibiotics. -She is clinically afebrile and hemodynamically stable. -She did have a CAT scan which showed noted to fill fluid collection or abscess.  Clinically although I feel she may need a possible incision and  drainage. I will get a surgical consult to further evaluate her. -continue empiric vancomycin, follow clinically.  2. Depression-continue Effexor.  3. Anxiety-continue when necessary Ativan.   All the records are reviewed and case discussed with Care Management/Social Workerr. Management plans discussed with the patient, family and they are in agreement.  CODE STATUS: Full  DVT Prophylaxis: Lovenox  TOTAL TIME TAKING CARE OF THIS PATIENT: 30 minutes.   POSSIBLE D/C IN 1-2 DAYS, DEPENDING ON CLINICAL CONDITION.   Henreitta Leber M.D on 07/24/2015 at 1:17 PM  Between 7am to 6pm - Pager - [phone]  After 6pm go to [email or web address] - password [email or web address] [facility]  [facility]  Office  [phone]  CC: Primary care physician; Carmon Ginsberg, PA

## 2015-07-24 NOTE — ED Provider Notes (Signed)
[facility] Emergency Department Provider Note  ____________________________________________  Time seen: Approximately 4:07 AM  I have reviewed the triage vital signs and the nursing notes.   HISTORY  Chief Complaint Wound Check    HPI Virginia Martin is a 50 y.o. female who presents to the ED from home with a chief complain of cellulitis. Patient was seen in the ED on 4/12 for right buttock  cellulitis with induration,based on Bactrim. She was seen again on 4/14 for same, CT scan negative for abscess, Keflex added. She returns this morning for new symptoms of chills, nausea, increased swelling and pain with area and now draining seropurulent drainage. Denies associated fever, chest pain, shortness of breath, abdominal pain, vomiting, diarrhea. Denies recent travel or trauma. Nothing makes her symptoms better or worse.   Past Medical History  Diagnosis Date  . Depression   . Cancer ([place])   . Thyroid disease     Patient Active Problem List   Diagnosis Date Noted  . Depression 02/16/2015  . Panic attacks 02/16/2015  . H/O: hypothyroidism 02/16/2015    Past Surgical History  Procedure Laterality Date  . Appendectomy  2002  . Breast biopsy  2006  . Abdominal hysterectomy  2002  . Wrist surgery Left 1991-1993    Current Outpatient Rx  Name  Route  Sig  Dispense  Refill  . cefdinir (OMNICEF) 300 MG capsule   Oral   Take 1 capsule (300 mg total) by mouth 2 (two) times daily.   14 capsule   0   . cephALEXin (KEFLEX) 500 MG capsule   Oral   Take 1 capsule (500 mg total) by mouth 2 (two) times daily.   20 capsule   0   . HYDROcodone-acetaminophen (NORCO) 5-325 MG tablet   Oral   Take 1 tablet by mouth every 6 (six) hours as needed for severe pain.   6 tablet   0   . LORazepam (ATIVAN) 1 MG tablet      TAKE 1 TABLET BY MOUTH AT BEDTIME AND 1/2 TAB EVERY 6 HOURS AS NEEDED FOR PANIC ATTACK   35 tablet   5     Not to exceed 5 additional fills  before 09/07/2015 ...   . oxyCODONE-acetaminophen (PERCOCET) 10-325 MG tablet   Oral   Take 1 tablet by mouth every 4 (four) hours as needed for pain.   20 tablet   0   . sulfamethoxazole-trimethoprim (BACTRIM DS,SEPTRA DS) 800-160 MG tablet   Oral   Take 1 tablet by mouth 2 (two) times daily.   20 tablet   0   . venlafaxine XR (EFFEXOR XR) 150 MG 24 hr capsule   Oral   Take 1 capsule (150 mg total) by mouth daily with breakfast.   30 capsule   5     Allergies Clindamycin hcl; Codeine; Amoxicillin; Diphenhydramine hcl; and Gabapentin  Family History  Problem Relation Age of Onset  . Depression Mother   . Colon polyps Mother   . Heart attack Father   . Hypertension Father   . Diabetes Father   . Depression Sister   . Diabetes Maternal Grandmother   . Cancer Maternal Grandmother   . COPD Paternal Grandmother     Social History Social History  Substance Use Topics  . Smoking status: Never Smoker   . Smokeless tobacco: Never Used  . Alcohol Use: No    Review of Systems  Constitutional: Positive for chills. No fever. Eyes: No visual  changes. ENT: No sore throat. Cardiovascular: Denies chest pain. Respiratory: Denies shortness of breath. Gastrointestinal: No abdominal pain.  No nausea, no vomiting.  No diarrhea.  No constipation. Genitourinary: Negative for dysuria. Musculoskeletal: Positive for right buttock pain. Negative for back pain. Skin: Positive for redness and warmth to right buttock. Negative for rash. Neurological: Negative for headaches, focal weakness or numbness.  10-point ROS otherwise negative.  ____________________________________________   PHYSICAL EXAM:  VITAL SIGNS: ED Triage Vitals  Enc Vitals Group     BP 07/23/15 2148 126/65 mmHg     Pulse Rate 07/23/15 2148 83     Resp 07/23/15 2148 18     Temp 07/23/15 2148 98.4 F (36.9 C)     Temp Source 07/23/15 2148 Oral     SpO2 07/23/15 2148 99 %     Weight 07/23/15 2148 198 lb  (89.812 kg)     Height 07/23/15 2148 5\' 5"  (1.651 m)     Head Cir --      Peak Flow --      Pain Score 07/23/15 2155 8     Pain Loc --      Pain Edu? --      Excl. in [place]? --     Constitutional: Alert and oriented. Well appearing and in mild acute distress. Eyes: Conjunctivae are normal. PERRL. EOMI. Head: Atraumatic. Nose: No congestion/rhinnorhea. Mouth/Throat: Mucous membranes are moist.  Oropharynx non-erythematous. Neck: No stridor.   Cardiovascular: Normal rate, regular rhythm. Grossly normal heart sounds.  Good peripheral circulation. Respiratory: Normal respiratory effort.  No retractions. Lungs CTAB. Gastrointestinal: Soft and nontender. No distention. No abdominal bruits. No CVA tenderness. Right buttock with extensive area of warmth/erythema; central area of induration with scab draining sero sanguinous fluid. No fluctuance. Musculoskeletal: No lower extremity tenderness nor edema.  No joint effusions. Neurologic:  Normal speech and language. No gross focal neurologic deficits are appreciated. No gait instability. Skin:  Skin is warm, dry and intact. No rash noted. Psychiatric: Mood and affect are normal. Speech and behavior are normal.  ____________________________________________   LABS (all labs ordered are listed, but only abnormal results are displayed)  Labs Reviewed  CULTURE, BLOOD (ROUTINE X 2)  CULTURE, BLOOD (ROUTINE X 2)  WOUND CULTURE  CBC WITH DIFFERENTIAL/PLATELET  BASIC METABOLIC PANEL   ____________________________________________  EKG  None ____________________________________________  RADIOLOGY  None ____________________________________________   PROCEDURES  Procedure(s) performed: None  Critical Care performed: No  ____________________________________________   INITIAL IMPRESSION / ASSESSMENT AND PLAN / ED COURSE  Pertinent labs & imaging results that were available during my care of the patient were reviewed by me and  considered in my medical decision making (see chart for details).  50 year old female who presents with worsening cellulitis despite Bactrim and Keflex therapy. Will obtain screening lab work including blood cultures 2, administer IV analgesia, initiate IV antibiotic and discuss with hospitalist for evaluation in the emergency department for admission. ____________________________________________   FINAL CLINICAL IMPRESSION(S) / ED DIAGNOSES  Final diagnoses:  Cellulitis of buttock      Paulette Blanch, MD 07/24/15 [phone]

## 2015-07-24 NOTE — H&P (Signed)
Virginia Martin is an 50 y.o. female.   Chief Complaint: Right gluteal abscess HPI: 50yrold female with right gluteal abscess. She states that she has had pain started seven days ago.  She states the area began getting more red, hard and painful.  She was seen in the ED 3 times prior for this.  She did get CT scan of pelvis that did not show any drainable collection.  She states that it has increased in pain and settled more around in the inferior area now.  She has been exposed to MRSA, her son had a MRSA foot infection last year.  She doesn't have a personal history of infections.  Patient deneis fevers but does endorse chills. The also states that she has had constipation x 1 week and has taken some stool softeners but without any success, she states that she doesn't even feel lik she has to go and is bloated.    Past Medical History  Diagnosis Date  . Depression   . [place]   . Thyroid disease     Past Surgical History  Procedure Laterality Date  . Appendectomy  2002  . Breast biopsy  2006  . Abdominal hysterectomy  2002  . Wrist surgery Left 1991-1993    Family History  Problem Relation Age of Onset  . Depression Mother   . Colon polyps Mother   . Heart attack Father   . Hypertension Father   . Diabetes Father   . Depression Sister   . Diabetes Maternal Grandmother   . Cancer Maternal Grandmother   . COPD Paternal Grandmother    Social History:  reports that she has never smoked. She has never used smokeless tobacco. She reports that she does not drink alcohol or use illicit drugs.  Allergies:  Allergies  Allergen Reactions  . Clindamycin Hcl Diarrhea    Other Reaction: GI trouble-hospitalized  . Codeine Rash  . Amoxicillin Itching    Other reaction(s): Other (See Comments) Other Reaction: blisters  . Diphenhydramine Hcl Other (See Comments)    Other Reaction: racing heart  . Gabapentin Rash    Other Reaction: red skin    Medications Prior to Admission   Medication Sig Dispense Refill  . cephALEXin (KEFLEX) 500 MG capsule Take 1 capsule (500 mg total) by mouth 2 (two) times daily. 20 capsule 0  . HYDROcodone-acetaminophen (NORCO) 5-325 MG tablet Take 1 tablet by mouth every 6 (six) hours as needed for severe pain. 6 tablet 0  . LORazepam (ATIVAN) 1 MG tablet TAKE 1 TABLET BY MOUTH AT BEDTIME AND 1/2 TAB EVERY 6 HOURS AS NEEDED FOR PANIC ATTACK 35 tablet 5  . oxyCODONE-acetaminophen (PERCOCET) 10-325 MG tablet Take 1 tablet by mouth every 4 (four) hours as needed for pain. 20 tablet 0  . venlafaxine XR (EFFEXOR XR) 150 MG 24 hr capsule Take 1 capsule (150 mg total) by mouth daily with breakfast. 30 capsule 5  . cefdinir (OMNICEF) 300 MG capsule Take 1 capsule (300 mg total) by mouth 2 (two) times daily. (Patient not taking: Reported on 07/24/2015) 14 capsule 0  . sulfamethoxazole-trimethoprim (BACTRIM DS,SEPTRA DS) 800-160 MG tablet Take 1 tablet by mouth 2 (two) times daily. (Patient not taking: Reported on 07/24/2015) 20 tablet 0    Results for orders placed or performed during the hospital encounter of 07/24/15 (from the past 48 hour(s))  Culture, blood (routine x 2)     Status: None (Preliminary result)   Collection Time: 07/24/15  4:33  AM  Result Value Ref Range   Specimen Description BLOOD LEFT ASSIST CONTROL    Special Requests BOTTLES DRAWN AEROBIC AND ANAEROBIC [place]    Culture NO GROWTH < 12 HOURS    Report Status PENDING   Culture, blood (routine x 2)     Status: None (Preliminary result)   Collection Time: 07/24/15  4:33 AM  Result Value Ref Range   Specimen Description BLOOD RIGHT ASSIST CONTROL    Special Requests BOTTLES DRAWN AEROBIC AND ANAEROBIC [place]    Culture NO GROWTH < 12 HOURS    Report Status PENDING   CBC with Differential     Status: None   Collection Time: 07/24/15  4:33 AM  Result Value Ref Range   WBC 6.5 3.6 - 11.0 K/uL   RBC 4.27 3.80 - 5.20 MIL/uL   Hemoglobin 12.8 12.0 - 16.0 g/dL    HCT 37.7 35.0 - 47.0 %   MCV 88.3 80.0 - 100.0 fL   MCH 29.9 26.0 - 34.0 pg   MCHC 33.9 32.0 - 36.0 g/dL   RDW 13.0 11.5 - 14.5 %   Platelets 283 150 - 440 K/uL   Neutrophils Relative % 58 %   Neutro Abs 3.8 1.4 - 6.5 K/uL   Lymphocytes Relative 26 %   Lymphs Abs 1.7 1.0 - 3.6 K/uL   Monocytes Relative 12 %   Monocytes Absolute 0.7 0.2 - 0.9 K/uL   Eosinophils Relative 3 %   Eosinophils Absolute 0.2 0 - 0.7 K/uL   Basophils Relative 1 %   Basophils Absolute 0.1 0 - 0.1 K/uL  Basic metabolic panel     Status: Abnormal   Collection Time: 07/24/15  4:33 AM  Result Value Ref Range   Sodium 134 (L) 135 - 145 mmol/L   Potassium 3.8 3.5 - 5.1 mmol/L   Chloride 101 101 - 111 mmol/L   CO2 27 22 - 32 mmol/L   Glucose, Bld 97 65 - 99 mg/dL   BUN 14 6 - 20 mg/dL   Creatinine, Ser 0.93 0.44 - 1.00 mg/dL   Calcium 8.7 (L) 8.9 - 10.3 mg/dL   GFR calc non Af Amer >60 >60 mL/min   GFR calc Af Amer >60 >60 mL/min    Comment: (NOTE) The eGFR has been calculated using the CKD EPI equation. This calculation has not been validated in all clinical situations. eGFR's persistently <60 mL/min signify possible Chronic Kidney Disease.    Anion gap 6 5 - 15  Wound culture     Status: None (Preliminary result)   Collection Time: 07/24/15  5:14 AM  Result Value Ref Range   Specimen Description BUTTOCKS    Special Requests Normal    Gram Stain FEW WBC SEEN FEW GRAM POSITIVE COCCI     Culture PENDING    Report Status PENDING    No results found.  Review of Systems  Constitutional: Positive for chills. Negative for fever and weight loss.  HENT: Negative for congestion.   Respiratory: Negative for sputum production, shortness of breath and wheezing.   Cardiovascular: Negative for chest pain and leg swelling.  Gastrointestinal: Negative for nausea, vomiting, abdominal pain, diarrhea and constipation.  Genitourinary: Negative for dysuria, urgency, frequency and hematuria.  Musculoskeletal:  Negative for myalgias, back pain and neck pain.  Skin: Negative for itching and rash.  Neurological: Negative for dizziness, loss of consciousness, weakness and headaches.  Psychiatric/Behavioral: Negative for depression and memory loss.  All other systems reviewed and are negative.  Blood pressure 123/70, pulse 64, temperature 97.9 F (36.6 C), temperature source Oral, resp. rate 16, height 5' 5" (1.651 m), weight 198 lb (89.812 kg), SpO2 98 %. Physical Exam  Vitals reviewed. Constitutional: She is oriented to person, place, and time. She appears well-developed and well-nourished. No distress.  HENT:  Head: Normocephalic and atraumatic.  Right Ear: External ear normal.  Left Ear: External ear normal.  Nose: Nose normal.  Mouth/Throat: Oropharynx is clear and moist. No oropharyngeal exudate.  Eyes: Conjunctivae and EOM are normal. Pupils are equal, round, and reactive to light. No scleral icterus.  Neck: Normal range of motion. Neck supple. No tracheal deviation present.  Cardiovascular: Normal rate, regular rhythm, normal heart sounds and intact distal pulses.  Exam reveals no gallop and no friction rub.   No murmur heard. Respiratory: Effort normal and breath sounds normal. No respiratory distress. She has no wheezes. She has no rales.  GI: Soft. Bowel sounds are normal. She exhibits no distension. There is no tenderness. There is no rebound.  Genitourinary:  Rectal: good anal wink, no hemorrhoids, 3cm lateral to right abscess 4cm with 8cm of induration and cellulitis and erythema, minimal purulent drainage with pressure  Musculoskeletal: Normal range of motion. She exhibits no edema or tenderness.  Neurological: She is alert and oriented to person, place, and time. No cranial nerve deficit.  Skin: Skin is warm and dry. There is erythema.  Psychiatric: She has a normal mood and affect. Her behavior is normal. Judgment and thought content normal.     Assessment/Plan 50 yr old  female with right gluteal abscess.  I have reviewed the patient's past medical history otherwise healthy.  I have reviewed her laboratory values with WBC WNL. I also reviewed the patient's CT scan with cellulitus and no fluid collection.  I also reviewed the radiology read. The patient has been on regular diet, and instead of take in the middle of the night will make NPO at midnight and take for I&D in AM.   Discussed the risks, benefits, alternatives and expected outcomes including bleeding, worsening infection, need for further procedures, recurrence and anesthetic risks.  My partner Dr. Adonis Huguenin will be her tomorrow and will be performing the procedure.  Patient understands and is in agreement with the plan.    Hubbard Robinson, MD 07/24/2015, 2:26 PM

## 2015-07-24 NOTE — Progress Notes (Signed)
Pharmacy Antibiotic Note  Virginia Martin is a 50 y.o. female admitted on 07/24/2015 with cellulitis.  Pharmacy has been consulted for vancomycin dosing.  Plan: Vancomycin 1 gm IV Q12H. Predicted trough 15 mcg/mL. Pharmacy will continue to follow and adjust as needed to maintain trough 10 to 15 mcg/mL.   Vd 48.9 L, Ke 0.072 hr-1, T1/2 9.6 hr  Height: 5\' 5"  (165.1 cm) Weight: 198 lb (89.812 kg) IBW/kg (Calculated) : 57  Temp (24hrs), Avg:98.4 F (36.9 C), Min:98.4 F (36.9 C), Max:98.4 F (36.9 C)   Recent Labs Lab 07/22/15 0302 07/24/15 0433  WBC 13.3* 6.5  CREATININE 0.79 0.93    Estimated Creatinine Clearance: 81.9 mL/min (by C-G formula based on Cr of 0.93).    Allergies  Allergen Reactions  . Clindamycin Hcl Diarrhea    Other Reaction: GI trouble-hospitalized  . Codeine Rash  . Amoxicillin Itching    Other reaction(s): Other (See Comments) Other Reaction: blisters  . Diphenhydramine Hcl Other (See Comments)    Other Reaction: racing heart  . Gabapentin Rash    Other Reaction: red skin    Thank you for allowing pharmacy to be a part of this patient's care.  Laural Benes, Pharm.D., BCPS Clinical Pharmacist 07/24/2015 6:56 AM

## 2015-07-24 NOTE — ED Notes (Signed)
MD at bedside. 

## 2015-07-24 NOTE — H&P (Signed)
[facility] [facility] [place] NAME: Virginia Martin    MR#:  [id number]  DATE OF BIRTH:  Apr 28, 1965  DATE OF ADMISSION:  07/24/2015  PRIMARY CARE PHYSICIAN: Carmon Ginsberg, PA   REQUESTING/REFERRING PHYSICIAN:   CHIEF COMPLAINT:   Chief Complaint  Patient presents with  . Wound Check    HISTORY OF PRESENT ILLNESS: Virginia Martin  is a 50 y.o. female with a known history of for thyroid disease, cancer presented to the emergency room with redness and swelling around the buttock area. Patient has this for the last 1 week and she tried outpatient antibiotics. Exactly week ago patient does not remember whether she had been bitten by any insect or any spider in the buttock area. After the sting there was inflammation and redness which increased in size. She came to the emergency room couple of days ago and tried oral Bactrim and Keflex antibiotic. But the redness and induration and increased. Pain in the buttock area is aching in nature 6 out of 10 scale of 1-10. No history of fever or chills. No history of any chest pain, no history of any shortness of breath.  PAST MEDICAL HISTORY:   Past Medical History  Diagnosis Date  . Depression   . Cancer ([place])   . Thyroid disease     PAST SURGICAL HISTORY: Past Surgical History  Procedure Laterality Date  . Appendectomy  2002  . Breast biopsy  2006  . Abdominal hysterectomy  2002  . Wrist surgery Left 1991-1993    SOCIAL HISTORY:  Social History  Substance Use Topics  . Smoking status: Never Smoker   . Smokeless tobacco: Never Used  . Alcohol Use: No    FAMILY HISTORY:  Family History  Problem Relation Age of Onset  . Depression Mother   . Colon polyps Mother   . Heart attack Father   . Hypertension Father   . Diabetes Father   . Depression Sister   . Diabetes Maternal Grandmother   . Cancer Maternal Grandmother   . COPD Paternal Grandmother     DRUG ALLERGIES:  Allergies  Allergen  Reactions  . Clindamycin Hcl Diarrhea    Other Reaction: GI trouble-hospitalized  . Codeine Rash  . Amoxicillin Itching    Other reaction(s): Other (See Comments) Other Reaction: blisters  . Diphenhydramine Hcl Other (See Comments)    Other Reaction: racing heart  . Gabapentin Rash    Other Reaction: red skin    REVIEW OF SYSTEMS:   CONSTITUTIONAL: No fever, fatigue or weakness.  EYES: No blurred or double vision.  EARS, NOSE, AND THROAT: No tinnitus or ear pain.  RESPIRATORY: No cough, shortness of breath, wheezing or hemoptysis.  CARDIOVASCULAR: No chest pain, orthopnea, edema.  GASTROINTESTINAL: No nausea, vomiting, diarrhea or abdominal pain.  GENITOURINARY: No dysuria, hematuria.  ENDOCRINE: No polyuria, nocturia,  HEMATOLOGY: No anemia, easy bruising or bleeding SKIN: redness and induration buttock area MUSCULOSKELETAL: No joint pain or arthritis.   NEUROLOGIC: No tingling, numbness, weakness.  PSYCHIATRY: No anxiety or depression.   MEDICATIONS AT HOME:  Prior to Admission medications   Medication Sig Start Date End Date Taking? Authorizing Provider  cephALEXin (KEFLEX) 500 MG capsule Take 1 capsule (500 mg total) by mouth 2 (two) times daily. 07/22/15 07/31/15 Yes Gregor Hams, MD  HYDROcodone-acetaminophen (NORCO) 5-325 MG tablet Take 1 tablet by mouth every 6 (six) hours as needed for severe pain. 07/20/15  Yes Jami L Hagler, PA-C  LORazepam (ATIVAN) 1 MG tablet TAKE 1 TABLET BY MOUTH AT BEDTIME AND 1/2 TAB EVERY 6 HOURS AS NEEDED FOR PANIC ATTACK 04/12/15  Yes Carmon Ginsberg, PA  oxyCODONE-acetaminophen (PERCOCET) 10-325 MG tablet Take 1 tablet by mouth every 4 (four) hours as needed for pain. 07/22/15 07/21/16 Yes Gregor Hams, MD  venlafaxine XR (EFFEXOR XR) 150 MG 24 hr capsule Take 1 capsule (150 mg total) by mouth daily with breakfast. 03/01/15  Yes Carmon Ginsberg, PA  cefdinir (OMNICEF) 300 MG capsule Take 1 capsule (300 mg total) by mouth 2 (two) times  daily. Patient not taking: Reported on 07/24/2015 05/16/15   Carmon Ginsberg, PA  sulfamethoxazole-trimethoprim (BACTRIM DS,SEPTRA DS) 800-160 MG tablet Take 1 tablet by mouth 2 (two) times daily. Patient not taking: Reported on 07/24/2015 07/20/15   Jami L Hagler, PA-C      PHYSICAL EXAMINATION:   VITAL SIGNS: Blood pressure 129/71, pulse 79, temperature 98.4 F (36.9 C), temperature source Oral, resp. rate 18, height 5\' 5"  (1.651 m), weight 89.812 kg (198 lb), SpO2 94 %.  GENERAL:  50 y.o.-year-old patient lying in the bed with no acute distress.  EYES: Pupils equal, round, reactive to light and accommodation. No scleral icterus. Extraocular muscles intact.  HEENT: Head atraumatic, normocephalic. Oropharynx and nasopharynx clear.  NECK:  Supple, no jugular venous distention. No thyroid enlargement, no tenderness.  LUNGS: Normal breath sounds bilaterally, no wheezing, rales,rhonchi or crepitation. No use of accessory muscles of respiration.  CARDIOVASCULAR: S1, S2 normal. No murmurs, rubs, or gallops.  ABDOMEN: Soft, nontender, nondistended. Bowel sounds present. No organomegaly or mass. Redness and induration buttock area. EXTREMITIES: No pedal edema, cyanosis, or clubbing.  NEUROLOGIC: Cranial nerves II through XII are intact. Muscle strength 5/5 in all extremities. Sensation intact. Gait not checked.  PSYCHIATRIC: The patient is alert and oriented x 3.  SKIN: No obvious rash, lesion, or ulcer.   LABORATORY PANEL:   CBC  Recent Labs Lab 07/22/15 0302 07/24/15 0433  WBC 13.3* 6.5  HGB 12.5 12.8  HCT 37.1 37.7  PLT 274 283  MCV 88.1 88.3  MCH 29.7 29.9  MCHC 33.7 33.9  RDW 13.4 13.0  LYMPHSABS  --  1.7  MONOABS  --  0.7  EOSABS  --  0.2  BASOSABS  --  0.1   ------------------------------------------------------------------------------------------------------------------  Chemistries   Recent Labs Lab 07/22/15 0302 07/24/15 0433  NA 135 134*  K 3.6 3.8  CL 103 101   CO2 24 27  GLUCOSE 94 97  BUN 16 14  CREATININE 0.79 0.93  CALCIUM 8.8* 8.7*  AST 32  --   ALT 34  --   ALKPHOS 84  --   BILITOT 0.3  --    ------------------------------------------------------------------------------------------------------------------ estimated creatinine clearance is 81.9 mL/min (by C-G formula based on Cr of 0.93). ------------------------------------------------------------------------------------------------------------------ No results for input(s): TSH, T4TOTAL, T3FREE, THYROIDAB in the last 72 hours.  Invalid input(s): FREET3   Coagulation profile No results for input(s): INR, PROTIME in the last 168 hours. ------------------------------------------------------------------------------------------------------------------- No results for input(s): DDIMER in the last 72 hours. -------------------------------------------------------------------------------------------------------------------  Cardiac Enzymes No results for input(s): CKMB, TROPONINI, MYOGLOBIN in the last 168 hours.  Invalid input(s): CK ------------------------------------------------------------------------------------------------------------------ Invalid input(s): POCBNP  ---------------------------------------------------------------------------------------------------------------  Urinalysis No results found for: COLORURINE, APPEARANCEUR, LABSPEC, PHURINE, GLUCOSEU, HGBUR, BILIRUBINUR, KETONESUR, PROTEINUR, UROBILINOGEN, NITRITE, LEUKOCYTESUR   RADIOLOGY: No results found.  EKG: No orders found for this or any previous visit.  IMPRESSION AND PLAN: 50 year old female patient history of thyroid disease  presented to the emergency room with the redness and tenderness and induration around the buttock area. Admitting diagnosis 1. Cellulitis of the buttock 2. Hypothyroidism 3.Mild hyponatremia 4.Buttock pain Treatment plan Admit patient to medical floor observation bed Start  patient on IV vancomycin antibiotic Pain management with oral Percocet and when necessary morphine DVT prophylaxis with subcutaneous Lovenox 40 MG daily.  All the records are reviewed and case discussed with ED provider. Management plans discussed with the patient, family and they are in agreement.  CODE STATUS:FULL Code Status History    This patient does not have a recorded code status. Please follow your organizational policy for patients in this situation.       TOTAL TIME TAKING CARE OF THIS PATIENT: 50 minutes.    Saundra Shelling M.D on 07/24/2015 at 6:46 AM  Between 7am to 6pm - Pager - [phone]  After 6pm go to [email or web address] - password [email or web address] [facility]  [facility]  Office  [phone]  CC: Primary care physician; Carmon Ginsberg, PA

## 2015-07-25 ENCOUNTER — Encounter
Admission: EM | Disposition: A | Discharge status: Home / Self Care | Payer: Self-pay | Source: Home / Self Care | Attending: Emergency Medicine

## 2015-07-25 ENCOUNTER — Encounter: Payer: Self-pay | Admitting: Anesthesiology

## 2015-07-25 ENCOUNTER — Observation Stay: Payer: BLUE CROSS/BLUE SHIELD | Admitting: Anesthesiology

## 2015-07-25 DIAGNOSIS — L03317 Cellulitis of buttock: Secondary | ICD-10-CM

## 2015-07-25 HISTORY — PX: INCISION AND DRAINAGE ABSCESS: SHX5864

## 2015-07-25 LAB — BASIC METABOLIC PANEL
Anion gap: 5 (ref 5–15)
BUN: 9 mg/dL (ref 6–20)
CO2: 27 mmol/L (ref 22–32)
Calcium: 8.9 mg/dL (ref 8.9–10.3)
Chloride: 104 mmol/L (ref 101–111)
Creatinine, Ser: 0.72 mg/dL (ref 0.44–1.00)
GFR calc Af Amer: >60 mL/min (ref >60)
GLUCOSE: 100 mg/dL — AB (ref 65–99)
POTASSIUM: 4.3 mmol/L (ref 3.5–5.1)
Sodium: 136 mmol/L (ref 135–145)

## 2015-07-25 LAB — CBC
HEMATOCRIT: 39.1 % (ref 35.0–47.0)
HEMOGLOBIN: 13.4 g/dL (ref 12.0–16.0)
MCH: 30.1 pg (ref 26.0–34.0)
MCHC: 34.3 g/dL (ref 32.0–36.0)
MCV: 87.6 fL (ref 80.0–100.0)
Platelets: 354 10*3/uL (ref 150–440)
RBC: 4.46 MIL/uL (ref 3.80–5.20)
RDW: 13.1 % (ref 11.5–14.5)
WBC: 5.9 10*3/uL (ref 3.6–11.0)

## 2015-07-25 LAB — SURGICAL PCR SCREEN
MRSA, PCR: NEGATIVE
STAPHYLOCOCCUS AUREUS: NEGATIVE

## 2015-07-25 SURGERY — INCISION AND DRAINAGE, ABSCESS
Anesthesia: General | Wound class: Dirty or Infected

## 2015-07-25 MED ORDER — FENTANYL CITRATE (PF) 100 MCG/2ML IJ SOLN: 25.0000 ug | INTRAMUSCULAR | Status: DC | PRN

## 2015-07-25 MED ORDER — LIDOCAINE HCL (CARDIAC) 20 MG/ML IV SOLN
INTRAVENOUS | Status: DC | PRN
  Administered 2015-07-25: 100 mg via INTRAVENOUS

## 2015-07-25 MED ORDER — OXYCODONE HCL 5 MG PO TABS: 5.0000 mg | ORAL_TABLET | Freq: Once | ORAL | Status: DC | PRN

## 2015-07-25 MED ORDER — PROPOFOL 10 MG/ML IV BOLUS
INTRAVENOUS | Status: DC | PRN
  Administered 2015-07-25: 150 mg via INTRAVENOUS

## 2015-07-25 MED ORDER — ONDANSETRON HCL 4 MG/2ML IJ SOLN
INTRAMUSCULAR | Status: DC | PRN
  Administered 2015-07-25: 4 mg via INTRAVENOUS

## 2015-07-25 MED ORDER — OXYCODONE HCL 5 MG/5ML PO SOLN: 5.0000 mg | Freq: Once | ORAL | Status: DC | PRN

## 2015-07-25 MED ORDER — FENTANYL CITRATE (PF) 100 MCG/2ML IJ SOLN
INTRAMUSCULAR | Status: DC | PRN
  Administered 2015-07-25: 50 ug via INTRAVENOUS

## 2015-07-25 MED ORDER — LACTATED RINGERS IV SOLN
INTRAVENOUS | Status: DC
  Administered 2015-07-25 (×3): via INTRAVENOUS

## 2015-07-25 SURGICAL SUPPLY — 24 items
BLADE SURG SZ11 CARB STEEL (BLADE) ×3 IMPLANT
BRIEF STRETCH MATERNITY 2XLG (MISCELLANEOUS) ×3 IMPLANT
CANISTER SUCT 1200ML W/VALVE (MISCELLANEOUS) ×3 IMPLANT
DRAIN PENROSE 1/4X12 LTX (DRAIN) ×3 IMPLANT
DRAPE LEGGINS SURG 28X43 STRL (DRAPES) ×3 IMPLANT
DRAPE UNDER BUTTOCK W/FLU (DRAPES) ×3 IMPLANT
ELECT REM PT RETURN 9FT ADLT (ELECTROSURGICAL) ×3
ELECTRODE REM PT RTRN 9FT ADLT (ELECTROSURGICAL) ×1 IMPLANT
GLOVE BIO SURGEON STRL SZ7.5 (GLOVE) ×6 IMPLANT
GLOVE INDICATOR 8.0 STRL GRN (GLOVE) ×6 IMPLANT
GOWN STRL REUS W/ TWL LRG LVL3 (GOWN DISPOSABLE) ×2 IMPLANT
GOWN STRL REUS W/TWL LRG LVL3 (GOWN DISPOSABLE) ×4
KIT RM TURNOVER STRD PROC AR (KITS) ×3 IMPLANT
NDL SAFETY 18GX1.5 (NEEDLE) IMPLANT
NDL SAFETY 25GX1.5 (NEEDLE) ×3 IMPLANT
NS IRRIG 500ML POUR BTL (IV SOLUTION) ×3 IMPLANT
PACK BASIN MINOR ARMC (MISCELLANEOUS) ×3 IMPLANT
PAD ABD DERMACEA PRESS 5X9 (GAUZE/BANDAGES/DRESSINGS) ×6 IMPLANT
SCRUB POVIDONE IODINE 4 OZ (MISCELLANEOUS) ×3 IMPLANT
SURGILUBE 2OZ TUBE FLIPTOP (MISCELLANEOUS) IMPLANT
SUT ETHILON 3-0 (SUTURE) ×3 IMPLANT
SWAB CULTURE AMIES ANAERIB BLU (MISCELLANEOUS) IMPLANT
SYR BULB EAR ULCER 3OZ GRN STR (SYRINGE) ×3 IMPLANT
SYRINGE 10CC LL (SYRINGE) ×3 IMPLANT

## 2015-07-25 NOTE — Transfer of Care (Signed)
Immediate Anesthesia Transfer of Care Note  Patient: Virginia Martin  Procedure(s) Performed: Procedure(s): INCISION AND DRAINAGE ABSCESS / GLUTEAL (N/A)  Patient Location: PACU  Anesthesia Type:General  Level of Consciousness: awake  Airway & Oxygen Therapy: Patient Spontanous Breathing  Post-op Assessment: Report given to RN  Post vital signs: Reviewed  Last Vitals:  Filed Vitals:   07/25/15 1219 07/25/15 1400  BP: 127/72 139/86  Pulse: 70 72  Temp: 36.2 C 36.8 C  Resp: 16 11    Complications: No apparent anesthesia complications

## 2015-07-25 NOTE — Progress Notes (Signed)
Patient discussed with Dr. Azalee Course Has draining right gluteal abscess Will take to OR for drainage today All questions answered for family  Clayburn Pert, MD [facility]

## 2015-07-25 NOTE — Progress Notes (Signed)
[place] [facility] [place] NAME: Latianna Speake    MR#:  [id number]  DATE OF BIRTH:  [birth year]  SUBJECTIVE:   Patient is here due to right buttock cellulitis. Going for I & D for gluteal abscess today.  Afebrile.    REVIEW OF SYSTEMS:    Review of Systems  Constitutional: Negative for fever and chills.  HENT: Negative for congestion and tinnitus.   Eyes: Negative for blurred vision and double vision.  Respiratory: Negative for cough, shortness of breath and wheezing.   Cardiovascular: Negative for chest pain, orthopnea and PND.  Gastrointestinal: Negative for nausea, vomiting, abdominal pain and diarrhea.  Genitourinary: Negative for dysuria and hematuria.  Neurological: Negative for dizziness, sensory change and focal weakness.  All other systems reviewed and are negative.   Nutrition: NPO for surgery Tolerating Diet: NPO Tolerating PT: Ambulatory   DRUG ALLERGIES:   Allergies  Allergen Reactions  . Clindamycin Hcl Diarrhea    Other Reaction: GI trouble-hospitalized  . Codeine Rash  . Amoxicillin Itching    Other reaction(s): Other (See Comments) Other Reaction: blisters  . Diphenhydramine Hcl Other (See Comments)    Other Reaction: racing heart  . Gabapentin Rash    Other Reaction: red skin    VITALS:  Blood pressure 139/86, pulse 72, temperature 98.2 F (36.8 C), temperature source Tympanic, resp. rate 11, height 5\' 5"  (1.651 m), weight 89.359 kg (197 lb), SpO2 100 %.  PHYSICAL EXAMINATION:   Physical Exam  GENERAL:  50 y.o.-year-old patient lying in the bed with no acute distress.  EYES: Pupils equal, round, reactive to light and accommodation. No scleral icterus. Extraocular muscles intact.  HEENT: Head atraumatic, normocephalic. Oropharynx and nasopharynx clear.  NECK:  Supple, no jugular venous distention. No thyroid enlargement, no tenderness.  LUNGS: Normal breath sounds bilaterally, no wheezing, rales, rhonchi. No  use of accessory muscles of respiration.  CARDIOVASCULAR: S1, S2 normal. No murmurs, rubs, or gallops.  ABDOMEN: Soft, nontender, nondistended. Bowel sounds present. No organomegaly or mass.  EXTREMITIES: No cyanosis, clubbing or edema b/l.    NEUROLOGIC: Cranial nerves II through XII are intact. No focal Motor or sensory deficits b/l.   PSYCHIATRIC: The patient is alert and oriented x 3.  SKIN: No obvious rash, lesion, right buttock cheek indurated area with redness surrounding. Small open area with minimal drainage. Consistent with cellulitis.   LABORATORY PANEL:   CBC  Recent Labs Lab 07/25/15 0535  WBC 5.9  HGB 13.4  HCT 39.1  PLT 354   ------------------------------------------------------------------------------------------------------------------  Chemistries   Recent Labs Lab 07/22/15 0302  07/25/15 0535  NA 135  < > 136  K 3.6  < > 4.3  CL 103  < > 104  CO2 24  < > 27  GLUCOSE 94  < > 100*  BUN 16  < > 9  CREATININE 0.79  < > 0.72  CALCIUM 8.8*  < > 8.9  AST 32  --   --   ALT 34  --   --   ALKPHOS 84  --   --   BILITOT 0.3  --   --   < > = values in this interval not displayed. ------------------------------------------------------------------------------------------------------------------  Cardiac Enzymes No results for input(s): TROPONINI in the last 168 hours. ------------------------------------------------------------------------------------------------------------------  RADIOLOGY:  No results found.   ASSESSMENT AND PLAN:   50 year old female with past medical history of anxiety, depression, hypothyroidism who presents to the hospital due to right buttock  redness swelling and pain.  1. Right buttock cellulitis with possible abscess-patient has failed oral antibiotic therapy and therefore admitted for IV antibiotics. -She did have a CAT scan which showed no fluid collection or abscess. Appreciate surgical input and going for  I & D today.   -continue empiric vancomycin, follow clinically.  2. Depression-continue Effexor.  3. Anxiety-continue when necessary Ativan.  Possibly d/c home tomorrow.    All the records are reviewed and case discussed with Care Management/Social Workerr. Management plans discussed with the patient, family and they are in agreement.  CODE STATUS: Full  DVT Prophylaxis: Lovenox  TOTAL TIME TAKING CARE OF THIS PATIENT: 25 minutes.   POSSIBLE D/C IN 1-2 DAYS, DEPENDING ON CLINICAL CONDITION.   Henreitta Leber M.D on 07/25/2015 at 2:15 PM  Between 7am to 6pm - Pager - [phone]  After 6pm go to [email or web address] - password [email or web address] [facility]  [place] [facility]  Office  [phone]  CC: Primary care physician; Carmon Ginsberg, PA

## 2015-07-25 NOTE — Brief Op Note (Signed)
07/24/2015 - 07/25/2015  1:48 PM  PATIENT:  Virginia Martin  50 y.o. female  PRE-OPERATIVE DIAGNOSIS:  abscess  POST-OPERATIVE DIAGNOSIS:  abscess  PROCEDURE:  Procedure(s): INCISION AND DRAINAGE ABSCESS / GLUTEAL (N/A)  SURGEON:  Surgeon(s) and Role:    * Clayburn Pert, MD - Primary  PHYSICIAN ASSISTANT:   ASSISTANTS: none   ANESTHESIA:   general  EBL:     BLOOD ADMINISTERED:none  DRAINS: Penrose drain in the right gluteal abscess   LOCAL MEDICATIONS USED:  NONE  SPECIMEN:  No Specimen  DISPOSITION OF SPECIMEN:  N/A  COUNTS:  YES  TOURNIQUET:  * No tourniquets in log *  DICTATION: .Dragon Dictation  PLAN OF CARE: return to inpatient  PATIENT DISPOSITION:  PACU - hemodynamically stable.   Delay start of Pharmacological VTE agent (>24hrs) due to surgical blood loss or risk of bleeding: not applicable

## 2015-07-25 NOTE — Anesthesia Postprocedure Evaluation (Signed)
Anesthesia Post Note  Patient: Virginia Martin  Procedure(s) Performed: Procedure(s) (LRB): INCISION AND DRAINAGE ABSCESS / GLUTEAL (N/A)  Patient location during evaluation: PACU Anesthesia Type: General Level of consciousness: awake and alert Pain management: pain level controlled Vital Signs Assessment: post-procedure vital signs reviewed and stable Respiratory status: spontaneous breathing, nonlabored ventilation, respiratory function stable and patient connected to nasal cannula oxygen Cardiovascular status: blood pressure returned to baseline and stable Postop Assessment: no signs of nausea or vomiting Anesthetic complications: no    Last Vitals:  Filed Vitals:   07/25/15 1430 07/25/15 1445  BP: 131/79 131/83  Pulse: 71 82  Temp:  36.6 C  Resp: 14 15    Last Pain:  Filed Vitals:   07/25/15 1447  PainSc: 0-No pain                 Precious Haws Piscitello

## 2015-07-25 NOTE — Anesthesia Preprocedure Evaluation (Signed)
Anesthesia Evaluation  Patient identified by MRN, date of birth, ID band Patient awake    Reviewed: Allergy & Precautions, H&P , NPO status , Patient's Chart, lab work & pertinent test results  History of Anesthesia Complications Negative for: history of anesthetic complications  Airway Mallampati: II  TM Distance: >3 FB Neck ROM: full    Dental  (+) Poor Dentition, Chipped   Pulmonary neg pulmonary ROS, neg shortness of breath,    Pulmonary exam normal breath sounds clear to auscultation       Cardiovascular Exercise Tolerance: Good (-) angina(-) Past MI and (-) DOE negative cardio ROS Normal cardiovascular exam Rhythm:regular Rate:Normal     Neuro/Psych PSYCHIATRIC DISORDERS Anxiety Depression negative neurological ROS     GI/Hepatic negative GI ROS, Neg liver ROS, neg GERD  ,  Endo/Other  negative endocrine ROS  Renal/GU negative Renal ROS  negative genitourinary   Musculoskeletal   Abdominal   Peds  Hematology negative hematology ROS (+)   Anesthesia Other Findings Past Medical History:   Depression                                                   Cancer ([place])                                                 Thyroid disease                                             Past Surgical History:   APPENDECTOMY                                     2002         BREAST BIOPSY                                    2006         ABDOMINAL HYSTERECTOMY                           2002         WRIST SURGERY                                   Left 1991-1993   BMI    Body Mass Index   32.78 kg/m 2      Reproductive/Obstetrics negative OB ROS                             Anesthesia Physical Anesthesia Plan  ASA: III  Anesthesia Plan: General LMA   Post-op Pain Management:    Induction:   Airway Management Planned:   Additional Equipment:   Intra-op Plan:   Post-operative Plan:    Informed Consent: I have reviewed the patients History and Physical, chart,  labs and discussed the procedure including the risks, benefits and alternatives for the proposed anesthesia with the patient or authorized representative who has indicated his/her understanding and acceptance.   Dental Advisory Given  Plan Discussed with: Anesthesiologist, CRNA and Surgeon  Anesthesia Plan Comments:         Anesthesia Quick Evaluation

## 2015-07-25 NOTE — Op Note (Signed)
   Pre-operative Diagnosis: Right gluteal abscess  Post-operative Diagnosis: Same  Surgeon: Clayburn Pert   Assistants: None  Anesthesia: General LMA anesthesia  ASA Class: 2  Surgeon: Clayburn Pert, MD FACS  Anesthesia: Gen. with endotracheal tube  Assistant: None  Procedure Details  The patient was seen again in the Holding Room. The benefits, complications, treatment options, and expected outcomes were discussed with the patient. The risks of bleeding, infection, recurrence of symptoms, failure to resolve symptoms,  any of which could require further surgery were reviewed with the patient.   The patient was taken to Operating Room, identified as Virginia Martin and the procedure verified.  A Time Out was held and the above information confirmed.  Prior to the induction of general anesthesia, antibiotic prophylaxis was administered. VTE prophylaxis was in place. General endotracheal anesthesia was then administered and tolerated well. After the induction, the rectal area was prepped with Betadine and draped in the sterile fashion. The patient was positioned in the high lithotomy position.  The palpable abscess was incised with a 15 blade scalpel. Purulent and sanguinous material were evacuated with compression. The area was probed with hemostat forceps and copiously irrigated with normal saline. Irrigation was used until the irrigant returned clear.  At this point the decision was made to place a quarter-inch Penrose drain. It is placed in the abscess cavity and secured with a 3-0 nylon suture. A dressing of ABDs pad and mesh underwear was placed over this. Patient was returned to a supine position where she was awoken from general anesthesia. There were no immediate, complications and all counts were correct at the end the procedure.  Findings: Right gluteal abscess   Estimated Blood Loss: 10 mL         Drains: Quarter-inch Penrose drain         Specimens: None           Complications: None                  Condition: Good   Clayburn Pert, MD, FACS

## 2015-07-26 LAB — VANCOMYCIN, TROUGH: Vancomycin Tr: 11 ug/mL (ref 10–20)

## 2015-07-26 MED ORDER — OXYCODONE HCL 5 MG PO TABS: 5.0000 mg | ORAL_TABLET | ORAL | Status: DC | PRN

## 2015-07-26 MED ORDER — OXYCODONE-ACETAMINOPHEN 5-325 MG PO TABS: 1.0000 | ORAL_TABLET | ORAL | Status: DC | PRN

## 2015-07-26 NOTE — Progress Notes (Signed)
Pharmacy Antibiotic Note  Virginia Martin is a 50 y.o. female admitted on 07/24/2015 with cellulitis.  Pharmacy has been consulted for vancomycin dosing.  Plan: Will continue vancomycin 1gm IV Q12H  Trough: 50mcg/ml prior to 4th dose. Would typically target higher trough due to abscess. However due to patient size she is at risk for elevated trough due to drug accumulation. Now with source control (s/p I&D 4/17). Will recheck trough after a few more doses and consider increasing dose if trough remains < 21mcg/ml.  Vd 48.9 L, Ke 0.072 hr-1, T1/2 9.6 hr  Height: 5\' 5"  (165.1 cm) Weight: 197 lb (89.359 kg) IBW/kg (Calculated) : 57  Temp (24hrs), Avg:97.8 F (36.6 C), Min:97.2 F (36.2 C), Max:98.2 F (36.8 C)   Recent Labs Lab 07/22/15 0302 07/24/15 0433 07/25/15 0535 07/26/15 0730  WBC 13.3* 6.5 5.9  --   CREATININE 0.79 0.93 0.72  --   VANCOTROUGH  --   --   --  11    Estimated Creatinine Clearance: 95 mL/min (by C-G formula based on Cr of 0.72).    Allergies  Allergen Reactions  . Clindamycin Hcl Diarrhea    Other Reaction: GI trouble-hospitalized  . Codeine Rash  . Amoxicillin Itching    Other reaction(s): Other (See Comments) Other Reaction: blisters  . Diphenhydramine Hcl Other (See Comments)    Other Reaction: racing heart  . Gabapentin Rash    Other Reaction: red skin   Micro: 4/15 BCx x 2: NGTD 4/16 Wound Cx: pending 4/17 MRSA PCR negative   Thank you for allowing pharmacy to be a part of this patient's care.  Honor Frison C, Pharm.D., BCPS Clinical Pharmacist 07/26/2015 8:43 AM

## 2015-07-26 NOTE — Progress Notes (Signed)
MD ordered patient to be discharged home.  Discharge instructions were reviewed with the patient and she voiced understanding.  Prescription was given to the patient.  IV was removed with catheter intact.  Patient given some supplies to take home for drainage.  All questions were answered.  Patient left via wheelchair escorted by auxillary.

## 2015-07-27 LAB — WOUND CULTURE: Special Requests: NORMAL

## 2015-07-27 NOTE — Final Progress Note (Signed)
1 Days Post-Op   Subjective:  Patient did well after incision and drainage  Vital signs in last 24 hours:   Last BM Date: 07/25/15  Intake/Output from previous day: 04/18 0701 - 04/19 0700 In: 233 [I.V.:233] Out: 200 [Urine:200]  GI: soft, non-tender; bowel sounds normal; no masses,  no organomegaly  Lab Results:  CBC  Recent Labs  07/25/15 0535  WBC 5.9  HGB 13.4  HCT 39.1  PLT 354   CMP     Component Value Date/Time   NA 136 07/25/2015 0535   NA 141 02/16/2015 0956   K 4.3 07/25/2015 0535   CL 104 07/25/2015 0535   CO2 27 07/25/2015 0535   GLUCOSE 100* 07/25/2015 0535   GLUCOSE 99 02/16/2015 0956   BUN 9 07/25/2015 0535   BUN 15 02/16/2015 0956   CREATININE 0.72 07/25/2015 0535   CALCIUM 8.9 07/25/2015 0535   PROT 7.7 07/22/2015 0302   ALBUMIN 4.0 07/22/2015 0302   ALBUMIN 4.5 02/16/2015 0956   AST 32 07/22/2015 0302   ALT 34 07/22/2015 0302   ALKPHOS 84 07/22/2015 0302   BILITOT 0.3 07/22/2015 0302   GFRNONAA >60 07/25/2015 0535   GFRAA >60 07/25/2015 0535   PT/INR No results for input(s): LABPROT, INR in the last 72 hours.  Studies/Results: No results found.  Assessment/Plan: Status post incision and drainage of perirectal abscess. Discharged home postop day 1.   Clayburn Pert, MD FACS General Surgeon  07/26/2015

## 2015-07-27 NOTE — Discharge Summary (Signed)
Patient ID: LYNDALL FAVA MRN: [id number] DOB/AGE: [birth year] 49 y.o.  Admit date: 07/24/2015 Discharge date: 07/26/2015  Discharge Diagnoses:  Perirectal abscess  Procedures Performed: Incision and drainage of perirectal abscess  Discharged Condition: good  Hospital Course: Patient admitted with a perirectal abscess. Taken to the operating room for drainage without difficulty. Able to tolerate a regular diet and oral antibiotics prior to discharge. She'll follow up in clinic next week for removal of drain.  Discharge Orders: Discharge Instructions    Change dressing (specify)    Complete by:  As directed   Dressing change: prn times per day using pads.     Diet - low sodium heart healthy    Complete by:  As directed      Increase activity slowly    Complete by:  As directed            Disposition: 01-Home or Self Care  Discharge Medications:   Medication List    STOP taking these medications        cefdinir 300 MG capsule  Commonly known as:  OMNICEF     cephALEXin 500 MG capsule  Commonly known as:  KEFLEX     HYDROcodone-acetaminophen 5-325 MG tablet  Commonly known as:  NORCO     oxyCODONE-acetaminophen 10-325 MG tablet  Commonly known as:  PERCOCET  Replaced by:  oxyCODONE-acetaminophen 5-325 MG tablet     sulfamethoxazole-trimethoprim 800-160 MG tablet  Commonly known as:  BACTRIM DS,SEPTRA DS      TAKE these medications        LORazepam 1 MG tablet  Commonly known as:  ATIVAN  TAKE 1 TABLET BY MOUTH AT BEDTIME AND 1/2 TAB EVERY 6 HOURS AS NEEDED FOR PANIC ATTACK     oxyCODONE 5 MG immediate release tablet  Commonly known as:  Oxy IR/ROXICODONE  Take 1 tablet (5 mg total) by mouth every 4 (four) hours as needed for severe pain.     oxyCODONE-acetaminophen 5-325 MG tablet  Commonly known as:  PERCOCET/ROXICET  Take 1 tablet by mouth every 4 (four) hours as needed for moderate pain or severe pain.     venlafaxine XR 150 MG 24 hr capsule   Commonly known as:  EFFEXOR XR  Take 1 capsule (150 mg total) by mouth daily with breakfast.         Follwup: Follow-up Information    Follow up with Jules Husbands, MD. Go on 08/02/2015.   Specialty:  General Surgery   Why:  Tuesday at 2:15pm For suture removal, For wound re-check   Contact information:   [address] [place] [address] [phone]       Signed: Clayburn Pert 07/27/2015, 6:48 PM

## 2015-07-29 LAB — CULTURE, BLOOD (ROUTINE X 2)
CULTURE: NO GROWTH
Culture: NO GROWTH

## 2015-08-02 ENCOUNTER — Ambulatory Visit (INDEPENDENT_AMBULATORY_CARE_PROVIDER_SITE_OTHER): Payer: BLUE CROSS/BLUE SHIELD | Admitting: Surgery

## 2015-08-02 ENCOUNTER — Encounter: Payer: Self-pay | Admitting: Surgery

## 2015-08-02 ENCOUNTER — Other Ambulatory Visit: Payer: Self-pay

## 2015-08-02 VITALS — BP 143/73 | HR 76 | Ht 65.0 in | Wt 200.0 lb

## 2015-08-02 DIAGNOSIS — Z09 Encounter for follow-up examination after completed treatment for conditions other than malignant neoplasm: Secondary | ICD-10-CM

## 2015-08-02 NOTE — Patient Instructions (Signed)
Please take warm sitz bath twice daily for one week. Please call our office if you have any questions or concerns.

## 2015-08-02 NOTE — Progress Notes (Signed)
S/p I/D gluteal abscess by Dr. Mertha Finders. Doing well, pain improved. Penrose fell out AVSS  PE NAD Almost completely closed wound only 1 cm opening healing well, no infection, mild induration, no flucutnace  A/P doing well RTC prn Sitz baths No antibiotics

## 2015-08-03 ENCOUNTER — Ambulatory Visit (INDEPENDENT_AMBULATORY_CARE_PROVIDER_SITE_OTHER): Payer: BLUE CROSS/BLUE SHIELD | Admitting: Family Medicine

## 2015-08-03 ENCOUNTER — Encounter: Payer: Self-pay | Admitting: Family Medicine

## 2015-08-03 VITALS — BP 120/80 | HR 88 | Temp 98.6°F | Resp 16 | Wt 199.6 lb

## 2015-08-03 DIAGNOSIS — L0231 Cutaneous abscess of buttock: Secondary | ICD-10-CM | POA: Diagnosis not present

## 2015-08-03 NOTE — Patient Instructions (Addendum)
May  Use Hibiclens antiseptic wash to cleanse skin around the healing wound. Ok to expose to salt water.

## 2015-08-03 NOTE — Progress Notes (Signed)
Subjective:     Patient ID: Virginia Martin, female   DOB: 29-Aug-1965, 50 y.o.   MRN: [id number]  HPI  Chief Complaint  Patient presents with  . Follow-up    Patient comes in office tday for follow up after having  I&D on 4/17. Patient reports that after surgery she was not prescribed antibiotics and states that she was not instructed on proper wound care. Patient reports since I&D she had returned back to hospital 3 days times because skin around  I& D was hot to the touch and red, patient reports being diagnosed with cellulits and an abcess. Patient states that she is concerned because she has multiple bumps around I&D  Had surgical f/u yesterday and felt to be healing. Not currently on abx with non-MRSA staph on C &S treated with Vancomycin while in the hospital. She is concerned that sutures holding prior drain are still present.   Review of Systems     Objective:   Physical Exam  Constitutional: She appears well-developed and well-nourished. No distress.  Skin:  Right buttock with 1 cm healing wound. Small area of underlying induration but no drainage expressed with minimal tenderness. 3-4 non-specific papules away from wound appear to be excoriated. No sutures noted.       Assessment:    1. Abscess, gluteal: healing well per surgical f/u 4/25     Plan:    Discussed use of Hibiclens antiseptic wash for papules on her buttock.

## 2015-09-11 ENCOUNTER — Other Ambulatory Visit: Payer: Self-pay | Admitting: Family Medicine

## 2015-09-15 ENCOUNTER — Ambulatory Visit (INDEPENDENT_AMBULATORY_CARE_PROVIDER_SITE_OTHER): Payer: BLUE CROSS/BLUE SHIELD | Admitting: Family Medicine

## 2015-09-15 ENCOUNTER — Encounter: Payer: Self-pay | Admitting: Family Medicine

## 2015-09-15 VITALS — BP 122/80 | HR 88 | Temp 98.5°F | Resp 16 | Wt 199.0 lb

## 2015-09-15 DIAGNOSIS — J40 Bronchitis, not specified as acute or chronic: Secondary | ICD-10-CM | POA: Diagnosis not present

## 2015-09-15 MED ORDER — DOXYCYCLINE HYCLATE 100 MG PO TABS: 100.0000 mg | ORAL_TABLET | Freq: Two times a day (BID) | ORAL | Status: DC

## 2015-09-15 NOTE — Patient Instructions (Signed)
Discontinue Q-tips for now. May use Delsym for cough. Continue Mucinex.

## 2015-09-15 NOTE — Progress Notes (Signed)
Subjective:     Patient ID: Virginia Martin, female   DOB: 06-Jun-1965, 50 y.o.   MRN: [id number]  HPI  Chief Complaint  Patient presents with  . Cough    Patient comes in office today with concerns of productive cough for the past 3 weeks, patient reports cough is productive of green phlegm and sinus drainage.  . Ear Pain    Patient reports that over the past 3 days or more she has noticed a "bump" in her right ear. Patient states bump is under the skin and painful to touch.   States she has been taking Mucinex and otc sinus preparations with mild improvement. Reports PND and cough becoming occasionally productive of purulent sputum over the last few days. Reports that bump in he right ear improved after taking left over Keflex. Feels like it is coming back. Does use Q-tips.   Review of Systems     Objective:   Physical Exam  Constitutional: She appears well-developed and well-nourished. No distress.  Ears: T.M's intact without inflammation; right distal ear canal with 2 mm.area of mild swelling. No pusutle or vesicle. Sinuses: non-tender Throat: no tonsillar enlargement or exudate Neck: no cervical adenopathy Lungs: clear     Assessment:    1. Bronchitis - doxycycline (VIBRA-TABS) 100 MG tablet; Take 1 tablet (100 mg total) by mouth 2 (two) times daily.  Dispense: 20 tablet; Refill: 0    Plan:    Discussed use of Delsym and Mucinex. Minimize use of  Q-tips.

## 2015-11-15 ENCOUNTER — Other Ambulatory Visit: Payer: Self-pay | Admitting: Family Medicine

## 2015-11-15 ENCOUNTER — Telehealth: Payer: Self-pay | Admitting: Family Medicine

## 2015-11-15 DIAGNOSIS — F419 Anxiety disorder, unspecified: Secondary | ICD-10-CM

## 2015-11-15 MED ORDER — LORAZEPAM 1 MG PO TABS: ORAL_TABLET | ORAL | 5 refills | Status: DC

## 2015-11-15 NOTE — Telephone Encounter (Signed)
Lorazepam up front for pickup. June Junious Silk may pick this up for her

## 2015-11-15 NOTE — Telephone Encounter (Signed)
Pt request a call back from [facility] about a refill. Pt stated that she needed to discuss it with [name] and would rather tell [name] what she needed. Thanks TNP

## 2015-11-15 NOTE — Telephone Encounter (Signed)
Spoke with patient on the phone shed is requesting that her Lorazepam be refilled,  It was last filled in january with only 5 refills. Patient states that [name] will be in tomorrow morning she would like it if Mrs. Virginia Martin would be able to pick up her prescription (printed). KW

## 2015-11-18 ENCOUNTER — Other Ambulatory Visit: Payer: Self-pay | Admitting: Family Medicine

## 2015-11-18 DIAGNOSIS — F32A Depression, unspecified: Secondary | ICD-10-CM

## 2015-11-18 DIAGNOSIS — F329 Major depressive disorder, single episode, unspecified: Secondary | ICD-10-CM

## 2015-11-18 MED ORDER — VENLAFAXINE HCL ER 150 MG PO CP24: ORAL_CAPSULE | ORAL | 1 refills | Status: DC

## 2015-12-15 ENCOUNTER — Telehealth: Payer: Self-pay | Admitting: Family Medicine

## 2015-12-15 NOTE — Telephone Encounter (Signed)
Advised pt to schedule OV. Renaldo Fiddler, CMA

## 2015-12-15 NOTE — Telephone Encounter (Signed)
Need to see her in the office for this

## 2015-12-15 NOTE — Telephone Encounter (Signed)
Pt reports she has an abscess on her abdomen. Was seen on 08/03/2014 for a gluteal abscess. Was given Doxy for this. Pt reports the sx are similar, and is asking for refill on Doxy. [facility], CMA

## 2015-12-15 NOTE — Telephone Encounter (Signed)
Pt requested [name] return her call b/c she has a question about an antibiotic. Pt wouldn't disclose any other information. Thanks TNP

## 2015-12-16 ENCOUNTER — Other Ambulatory Visit: Payer: Self-pay | Admitting: Family Medicine

## 2015-12-16 ENCOUNTER — Ambulatory Visit (INDEPENDENT_AMBULATORY_CARE_PROVIDER_SITE_OTHER): Payer: BLUE CROSS/BLUE SHIELD | Admitting: Family Medicine

## 2015-12-16 VITALS — BP 130/70 | HR 80 | Temp 98.4°F | Resp 16 | Wt 201.4 lb

## 2015-12-16 DIAGNOSIS — L0293 Carbuncle, unspecified: Secondary | ICD-10-CM

## 2015-12-16 DIAGNOSIS — L03311 Cellulitis of abdominal wall: Secondary | ICD-10-CM

## 2015-12-16 MED ORDER — HYDROCODONE-ACETAMINOPHEN 5-325 MG PO TABS: ORAL_TABLET | ORAL | 0 refills | Status: DC

## 2015-12-16 MED ORDER — DOXYCYCLINE HYCLATE 100 MG PO TABS: 100.0000 mg | ORAL_TABLET | Freq: Two times a day (BID) | ORAL | 0 refills | Status: DC

## 2015-12-16 NOTE — Patient Instructions (Signed)
Return if redness, fever, chills, or increased drainage.

## 2015-12-16 NOTE — Progress Notes (Signed)
Subjective:     Patient ID: Virginia Martin, female   DOB: [birth year], 50 y.o.   MRN: [id number]  HPI  Chief Complaint  Patient presents with  . Skin Problem    Patient comes in office today with concerns of possible boil on her left lower abdomen. Patient states that boil appearead earlier this week and was a white head. Patient reports when she "popped" it green drainage came from site. Patient reports redness, swelling, and states that skin hot to touch.   States she now has an expanding area of redness and pain across her abdomen. Had gluteal abscess with  Non-MRSA staph. Earlier this year. No fever or chills but felt her face was flushed.   Review of Systems     Objective:   Physical Exam  Constitutional: She appears well-developed and well-nourished. No distress.  Skin:  LLQ with area of induration with small eschar and no drainage. Erythema expanding across her lower abdomen symmetrically.       Assessment:    1. Carbuncle - doxycycline (VIBRA-TABS) 100 MG tablet; Take 1 tablet (100 mg total) by mouth 2 (two) times daily.  Dispense: 14 tablet; Refill: 0 - HYDROcodone-acetaminophen (NORCO/VICODIN) 5-325 MG tablet; One every 4-6 hours as needed for pain  Dispense: 20 tablet; Refill: 0  2. Cellulitis of abdominal wall - doxycycline (VIBRA-TABS) 100 MG tablet; Take 1 tablet (100 mg total) by mouth 2 (two) times daily.  Dispense: 14 tablet; Refill: 0    Plan:    Return for expanding erythema, increased pain, fever or chills.

## 2016-01-19 ENCOUNTER — Telehealth: Payer: Self-pay

## 2016-01-19 ENCOUNTER — Telehealth: Payer: Self-pay | Admitting: Family Medicine

## 2016-01-19 DIAGNOSIS — L0293 Carbuncle, unspecified: Secondary | ICD-10-CM

## 2016-01-19 DIAGNOSIS — L03311 Cellulitis of abdominal wall: Secondary | ICD-10-CM

## 2016-01-19 MED ORDER — DOXYCYCLINE HYCLATE 100 MG PO TABS: 100.0000 mg | ORAL_TABLET | Freq: Two times a day (BID) | ORAL | 0 refills | Status: DC

## 2016-01-19 NOTE — Telephone Encounter (Signed)
Patient of Bobs: Patient called back office stating that boil on abdomen has resolved and that she now has a boil on her pubic area. Patient states that boil has been present for the past few days and is hard to the touch. Patient states yesterday that boil appeared to have head and when she squeezed she had very little pus come out. Patient is requesting a refill on her Doxycycline, I advised patient that and office visit would most likely be needed she states that she wouldn't like to come back in the office again for this issue. Please advise. KW

## 2016-01-19 NOTE — Telephone Encounter (Signed)
Have sent in rx for doxycycline. Need to see Virginia Martin if not resolved by the time abx is finished.

## 2016-01-19 NOTE — Telephone Encounter (Signed)
Pt contacted office for refill request on the following medications:  doxycycline (VIBRA-TABS) 100 MG tablet.  [facility].  [phone]

## 2016-01-19 NOTE — Telephone Encounter (Signed)
Pt advised. Emily Drozdowski, CMA  

## 2016-01-19 NOTE — Telephone Encounter (Signed)
Left message for patient to call office back. Patient was last seen in office on 12/16/15 for Cellulitis and Carbuncle on lower left abdomen, patient was prescribed doxycyline at that visit.Need further clarification if carbuncle has returned?KW

## 2016-01-30 ENCOUNTER — Ambulatory Visit (INDEPENDENT_AMBULATORY_CARE_PROVIDER_SITE_OTHER): Payer: BLUE CROSS/BLUE SHIELD | Admitting: Family Medicine

## 2016-01-30 ENCOUNTER — Encounter: Payer: Self-pay | Admitting: Family Medicine

## 2016-01-30 VITALS — BP 128/72 | HR 80 | Temp 98.9°F | Resp 16 | Wt 203.0 lb

## 2016-01-30 DIAGNOSIS — J028 Acute pharyngitis due to other specified organisms: Secondary | ICD-10-CM

## 2016-01-30 DIAGNOSIS — B370 Candidal stomatitis: Secondary | ICD-10-CM | POA: Diagnosis not present

## 2016-01-30 LAB — POCT RAPID STREP A (OFFICE): RAPID STREP A SCREEN: NEGATIVE

## 2016-01-30 MED ORDER — NYSTATIN 100000 UNIT/ML MT SUSP: 5.0000 mL | Freq: Four times a day (QID) | OROMUCOSAL | 1 refills | Status: DC

## 2016-01-30 NOTE — Patient Instructions (Signed)
Let me know if know improving over the course of the week.

## 2016-01-30 NOTE — Progress Notes (Signed)
Subjective:     Patient ID: Virginia Martin, female   DOB: [birth year], 50 y.o.   MRN: [id number]  HPI  Chief Complaint  Patient presents with  . Sore Throat    Since yesterday. Patient reports that she does have difficulity swallowing. She also has noticed white patches in the back of her throat. She denies any other symptoms.     States she was at a wedding prior to the onset of her sx. States she completed a course of doxycycline last week for an early skin infection.   Review of Systems     Objective:   Physical Exam  Constitutional: She appears well-developed and well-nourished. No distress.  Ears: T.M's intact without inflammation Throat: no tonsillar enlargement, posterior white plaques on her posterior pharynx and uvula. Mild erythema Neck: no cervical adenopathy Lungs: clear     Assessment:    1. Pharyngitis due to other organis - POCT rapid strep A  2. Thrush, oral - nystatin (MYCOSTATIN) 100000 UNIT/ML suspension; Take 5 mLs (500,000 Units total) by mouth 4 (four) times daily. Swish, hold for a few seconds then swallow  Dispense: 120 mL; Refill: 1    Plan:    Call if not improving over the next few days.

## 2016-02-01 ENCOUNTER — Telehealth: Payer: Self-pay | Admitting: Family Medicine

## 2016-02-01 ENCOUNTER — Other Ambulatory Visit: Payer: Self-pay | Admitting: Family Medicine

## 2016-02-01 DIAGNOSIS — B370 Candidal stomatitis: Secondary | ICD-10-CM

## 2016-02-01 MED ORDER — FLUCONAZOLE 100 MG PO TABS: ORAL_TABLET | ORAL | 0 refills | Status: DC

## 2016-02-01 NOTE — Telephone Encounter (Signed)
I have sent in Diflucan pills

## 2016-02-01 NOTE — Telephone Encounter (Signed)
Patient has been advised. KW 

## 2016-02-01 NOTE — Telephone Encounter (Signed)
Pt states she rec'd a Rx on Monday and it is causing her to have nausea and throwing up.  Pt is requesting the mouth wash changed to a pill.  [facility].  [phone]

## 2016-02-01 NOTE — Telephone Encounter (Signed)
Please review. KW 

## 2016-02-15 NOTE — Telephone Encounter (Signed)
Entered in error.KW 

## 2016-03-30 ENCOUNTER — Encounter: Payer: Self-pay | Admitting: Family Medicine

## 2016-03-30 ENCOUNTER — Ambulatory Visit (INDEPENDENT_AMBULATORY_CARE_PROVIDER_SITE_OTHER): Payer: BLUE CROSS/BLUE SHIELD | Admitting: Family Medicine

## 2016-03-30 VITALS — BP 120/86 | HR 88 | Temp 97.8°F | Resp 16 | Wt 202.0 lb

## 2016-03-30 DIAGNOSIS — M5431 Sciatica, right side: Secondary | ICD-10-CM

## 2016-03-30 MED ORDER — PREDNISONE 20 MG PO TABS: ORAL_TABLET | ORAL | 0 refills | Status: DC

## 2016-03-30 MED ORDER — HYDROCODONE-ACETAMINOPHEN 5-325 MG PO TABS: ORAL_TABLET | ORAL | 0 refills | Status: DC

## 2016-03-30 NOTE — Patient Instructions (Signed)
Let me know next week if not improving.
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
## 2016-04-09 DIAGNOSIS — D241 Benign neoplasm of right breast: Secondary | ICD-10-CM

## 2016-04-09 HISTORY — DX: Benign neoplasm of right breast: D24.1

## 2016-04-09 HISTORY — PX: BREAST EXCISIONAL BIOPSY: SUR124

## 2016-04-23 ENCOUNTER — Encounter: Payer: Self-pay | Admitting: Family Medicine

## 2016-04-23 ENCOUNTER — Ambulatory Visit (INDEPENDENT_AMBULATORY_CARE_PROVIDER_SITE_OTHER): Payer: BLUE CROSS/BLUE SHIELD | Admitting: Family Medicine

## 2016-04-23 VITALS — BP 122/80 | HR 82 | Temp 98.5°F | Resp 16 | Wt 201.0 lb

## 2016-04-23 DIAGNOSIS — J069 Acute upper respiratory infection, unspecified: Secondary | ICD-10-CM | POA: Diagnosis not present

## 2016-04-23 DIAGNOSIS — B9789 Other viral agents as the cause of diseases classified elsewhere: Secondary | ICD-10-CM

## 2016-04-23 MED ORDER — HYDROCODONE-HOMATROPINE 5-1.5 MG/5ML PO SYRP: ORAL_SOLUTION | ORAL | 0 refills | Status: DC

## 2016-04-23 NOTE — Progress Notes (Signed)
Subjective:     Patient ID: Virginia Martin, female   DOB: 10-05-1965, 51 y.o.   MRN: [id number]  HPI  Chief Complaint  Patient presents with  . Cough    Patient comes into office today with concrns of cough and sore throat for the past 48hrs. Patient states that her husband has been sick with cold like symptoms are her daughter was recently diagnosed with flu last week. Patient states that she has taken otc Mucinex and Advil.   Has not had the flu shot this season.   Review of Systems     Objective:   Physical Exam  Constitutional: She appears well-developed and well-nourished. No distress.  Ears: T.M's intact without inflammation Throat: no tonsillar enlargement or exudate Neck: no cervical adenopathy Lungs: clear     Assessment:    1. Viral upper respiratory tract infection - HYDROcodone-homatropine (HYCODAN) 5-1.5 MG/5ML syrup; 5 ml 4-6 hours as needed for cough  Dispense: 240 mL; Refill: 0    Plan:    Discussed use of Mucinex D and Delsym.

## 2016-04-23 NOTE — Patient Instructions (Signed)
Discussed use of Mucinex D for congestion and Delsym for cough. Call me if sinuses not improving by the end of the week. 

## 2016-05-01 ENCOUNTER — Telehealth: Payer: Self-pay

## 2016-05-01 NOTE — Telephone Encounter (Signed)
Patient called office today stating that her symptoms have not improved since 04/23/16, patient reports that she was instructed to take Mucinex and Delsym and she has done so without any changes in symptoms. Patient reports that she is coughing thick green sputum now and request that you call in antibiotic [facility].  Please review last office note and advise. KW

## 2016-05-01 NOTE — Telephone Encounter (Signed)
Reports sinuses are clear. Discussed continuing Mucinex. Phone f/u on 1/26 if not improving.

## 2016-05-08 ENCOUNTER — Other Ambulatory Visit: Payer: Self-pay | Admitting: Family Medicine

## 2016-05-08 MED ORDER — AZITHROMYCIN 250 MG PO TABS: ORAL_TABLET | ORAL | 0 refills | Status: DC

## 2016-05-08 NOTE — Telephone Encounter (Signed)
Pt stated that her cough hasn't improved and it takes a lot out of her to cough. Pt stated she gets SOB when she walks upstairs or walks for a short period of time. She stated that the effort it takes to cough is making her feel wiped out and she feels the force of the cough is the cause of her headache. Pt stated it is a non productive cough but she does feel like she has a drainage. Pt stated she has been taking OTC medication she was advised to try but nothing is helping. [facility]. Please advise. Thanks TNP

## 2016-05-08 NOTE — Telephone Encounter (Signed)
Let her know I have sent in Zithromax to cover for bronchitis

## 2016-05-08 NOTE — Telephone Encounter (Signed)
Patient was seen in office 15 days ago and diagnosed with Viral URI, with symptoms persistent this long would you like patient to come back in office to be reassessed? KW

## 2016-05-08 NOTE — Telephone Encounter (Signed)
Advised  ED 

## 2016-05-24 ENCOUNTER — Telehealth: Payer: Self-pay

## 2016-05-24 ENCOUNTER — Other Ambulatory Visit: Payer: Self-pay | Admitting: Family Medicine

## 2016-05-24 DIAGNOSIS — F419 Anxiety disorder, unspecified: Secondary | ICD-10-CM

## 2016-05-24 MED ORDER — VENLAFAXINE HCL ER 150 MG PO CP24: ORAL_CAPSULE | ORAL | 1 refills | Status: DC

## 2016-05-24 MED ORDER — LORAZEPAM 1 MG PO TABS: ORAL_TABLET | ORAL | 5 refills | Status: DC

## 2016-05-24 NOTE — Telephone Encounter (Signed)
Patient called officer requesting a refill on her Lorazepam 1mg , she states that she will run out on Sunday. Patient also request refill for her Effexor XR, patient also wanted to let you know that she still has a productive cough with phlegm. KW

## 2016-05-24 NOTE — Telephone Encounter (Signed)
[id number] 

## 2016-05-24 NOTE — Progress Notes (Signed)
[facility]

## 2016-05-24 NOTE — Telephone Encounter (Signed)
I have refilled her medication. Will need see her again if sputum still colored or accompanied by fever. Would use Mucinex DM otherwise.

## 2016-05-24 NOTE — Telephone Encounter (Signed)
Patient was advised. KW 

## 2016-06-12 ENCOUNTER — Other Ambulatory Visit: Payer: Self-pay | Admitting: Family Medicine

## 2016-06-12 DIAGNOSIS — F32A Depression, unspecified: Secondary | ICD-10-CM

## 2016-06-12 DIAGNOSIS — F329 Major depressive disorder, single episode, unspecified: Secondary | ICD-10-CM

## 2016-07-02 ENCOUNTER — Telehealth: Payer: Self-pay | Admitting: Family Medicine

## 2016-07-02 NOTE — Telephone Encounter (Signed)
Pt wanted Virginia Martin to call her regarding a place that has came up on her again.  She was not specific.  She said you would know what it was regarding?  Thanks [employer]

## 2016-07-03 ENCOUNTER — Other Ambulatory Visit: Payer: Self-pay | Admitting: Family Medicine

## 2016-07-03 DIAGNOSIS — L0293 Carbuncle, unspecified: Secondary | ICD-10-CM

## 2016-07-03 MED ORDER — DOXYCYCLINE HYCLATE 100 MG PO TABS: 100.0000 mg | ORAL_TABLET | Freq: Two times a day (BID) | ORAL | 0 refills | Status: DC

## 2016-07-03 NOTE — Telephone Encounter (Signed)
Spoke with patient on the phone she states for the past week she has had a boil right in between her buttocks, patient denies it has grown in size but states that it has become more painful, warm to the touch and red, she denies drainage or seeing a head to the area. Patient wanted to know if you could call her [facility] [place]? Please advise. KW

## 2016-07-03 NOTE — Telephone Encounter (Signed)
Advised  ED 

## 2016-07-03 NOTE — Telephone Encounter (Signed)
I have sent in doxycycline to her pharmacy. Would also want to her soak this in the tub for 10 minutes daily. If not resolving will need to come in and see me.

## 2016-07-04 ENCOUNTER — Ambulatory Visit (INDEPENDENT_AMBULATORY_CARE_PROVIDER_SITE_OTHER): Payer: BLUE CROSS/BLUE SHIELD | Admitting: Family Medicine

## 2016-07-04 ENCOUNTER — Encounter: Payer: Self-pay | Admitting: Family Medicine

## 2016-07-04 VITALS — BP 104/60 | HR 82 | Temp 98.8°F | Resp 16 | Wt 201.6 lb

## 2016-07-04 DIAGNOSIS — L0293 Carbuncle, unspecified: Secondary | ICD-10-CM

## 2016-07-04 NOTE — Progress Notes (Addendum)
Subjective:     Patient ID: Virginia Martin, female   DOB: 1965-04-28, 51 y.o.   MRN: [id number]  HPI  Chief Complaint  Patient presents with  . Recurrent Skin Infections    Patient comes in office today with concerns of boil in betwen her buttock for the psat week. Patient states that she took a sitz bath the other night and had squeezed boil and reports that there was bloody discolored drainage. Patient states that area is very hard and tender to the touch.   Started doxycycline last night per my instructions. Has had prior furuncles but no MRSA.   Review of Systems     Objective:   Physical Exam  Constitutional: She appears well-developed and well-nourished. No distress.  Skin:  Left buttock with carbuncle and purulent/bloody drainage. Mild induration. C & S obtained.       Assessment:    1. Carbuncle: start doxycycline pending culture results. - WOUND CULTURE    Plan:    Continue sitz baths and abx. Further f/u pending wound culture.

## 2016-07-04 NOTE — Patient Instructions (Signed)
Continue sitz baths and doxycycline. We will call you with the wound culture results.

## 2016-07-08 LAB — WOUND CULTURE

## 2016-07-09 NOTE — Progress Notes (Signed)
Advised  ED 

## 2016-11-01 ENCOUNTER — Other Ambulatory Visit: Payer: Self-pay | Admitting: Family Medicine

## 2016-11-01 DIAGNOSIS — F419 Anxiety disorder, unspecified: Secondary | ICD-10-CM

## 2016-11-01 MED ORDER — LORAZEPAM 1 MG PO TABS: ORAL_TABLET | ORAL | 5 refills | Status: DC

## 2016-11-01 NOTE — Progress Notes (Signed)
Prescription was called into pharmacy. KW

## 2016-11-22 ENCOUNTER — Other Ambulatory Visit: Payer: Self-pay | Admitting: Family Medicine

## 2016-11-22 DIAGNOSIS — F419 Anxiety disorder, unspecified: Secondary | ICD-10-CM

## 2016-11-22 MED ORDER — VENLAFAXINE HCL ER 150 MG PO CP24: ORAL_CAPSULE | ORAL | 1 refills | Status: DC

## 2017-03-08 ENCOUNTER — Ambulatory Visit (INDEPENDENT_AMBULATORY_CARE_PROVIDER_SITE_OTHER): Payer: BLUE CROSS/BLUE SHIELD | Admitting: Family Medicine

## 2017-03-08 ENCOUNTER — Encounter: Payer: Self-pay | Admitting: Family Medicine

## 2017-03-08 VITALS — BP 124/94 | HR 91 | Temp 99.0°F | Resp 16 | Wt 199.8 lb

## 2017-03-08 DIAGNOSIS — F410 Panic disorder [episodic paroxysmal anxiety] without agoraphobia: Secondary | ICD-10-CM

## 2017-03-08 DIAGNOSIS — J019 Acute sinusitis, unspecified: Secondary | ICD-10-CM | POA: Diagnosis not present

## 2017-03-08 MED ORDER — HYDROCODONE-HOMATROPINE 5-1.5 MG/5ML PO SYRP: ORAL_SOLUTION | ORAL | 0 refills | Status: DC

## 2017-03-08 MED ORDER — LORAZEPAM 1 MG PO TABS: ORAL_TABLET | ORAL | 5 refills | Status: DC

## 2017-03-08 MED ORDER — DOXYCYCLINE HYCLATE 100 MG PO TABS: 100.0000 mg | ORAL_TABLET | Freq: Two times a day (BID) | ORAL | 0 refills | Status: DC

## 2017-03-08 NOTE — Progress Notes (Signed)
Subjective:     Patient ID: Virginia Martin, female   DOB: [birth year], 51 y.o.   MRN: [id number] Chief Complaint  Patient presents with  . Sinus Problem    Patient comes in office today with concerns of possible sinus infection for the past two weeks. Patient reports symptoms of layrngitis, dry cough and sinus pain and pressure. Patient states that she has taken otc Advil Sinus  Reports persistent cold sx with purulent PND. HPI   Review of Systems  Gastrointestinal:       Does not wish colon cancer screening at this time.       Objective:   Physical Exam  Constitutional: She appears well-developed and well-nourished. No distress.  Ears: T.M's intact without inflammation Sinuses: non-tender Throat: no tonsillar enlargement or exudate Neck: no cervical adenopathy Lungs: clear     Assessment:    1. Acute sinusitis, recurrence not specified, unspecified location - doxycycline (VIBRA-TABS) 100 MG tablet; Take 1 tablet (100 mg total) by mouth 2 (two) times daily.  Dispense: 20 tablet; Refill: 0 - HYDROcodone-homatropine (HYCODAN) 5-1.5 MG/5ML syrup; 5 ml 4-6 hours as needed for cough  Dispense: 120 mL; Refill: 0  2. Panic attacks - LORazepam (ATIVAN) 1 MG tablet; TAKE 1 TABLET BY MOUTH AT BEDTIME AND 1/2 TAB EVERY 6 HOURS AS NEEDED FOR PANIC ATTACK  Dispense: 35 tablet; Refill: 5      Plan:    Continue Advil cold and sinus

## 2017-03-08 NOTE — Patient Instructions (Signed)
Continue Advil cold and sinus.

## 2017-04-12 ENCOUNTER — Ambulatory Visit (INDEPENDENT_AMBULATORY_CARE_PROVIDER_SITE_OTHER): Payer: BLUE CROSS/BLUE SHIELD | Admitting: Family Medicine

## 2017-04-12 ENCOUNTER — Encounter: Payer: Self-pay | Admitting: Family Medicine

## 2017-04-12 VITALS — BP 124/100 | HR 77 | Temp 98.6°F | Resp 15 | Wt 201.2 lb

## 2017-04-12 DIAGNOSIS — R202 Paresthesia of skin: Secondary | ICD-10-CM

## 2017-04-12 DIAGNOSIS — Z13220 Encounter for screening for lipoid disorders: Secondary | ICD-10-CM | POA: Diagnosis not present

## 2017-04-12 DIAGNOSIS — J0191 Acute recurrent sinusitis, unspecified: Secondary | ICD-10-CM | POA: Diagnosis not present

## 2017-04-12 MED ORDER — CEFDINIR 300 MG PO CAPS: 300.0000 mg | ORAL_CAPSULE | Freq: Two times a day (BID) | ORAL | 0 refills | Status: DC

## 2017-04-12 NOTE — Patient Instructions (Signed)
We will cal you with the lab results. 

## 2017-04-12 NOTE — Progress Notes (Signed)
Subjective:     Patient ID: Virginia Martin, female   DOB: Mar 27, 1966, 52 y.o.   MRN: [id number] Chief Complaint  Patient presents with  . Cough    Patient returns to office today for follow up after being since 03/08/17 for sinusitis. Patient was prescribed docycycline and Hycodan and states that she did not notice improvement with cough on medication. Patient has tried taking Mucinex and Advil Sinus Relief.  . Numbness    Patient has concerns of numbness and tingling in both feet for the past several weeks. Patient is requesting labs to check for sugar and thyroid level given her family history.    HPI States she continues to have PND with cough, hoarseness, and occasional green drainage. Does not feel she cleared prior sinus infection rx'ed with  Doxycycline. Noticed increased discomfort in her feet for the last several weeks. Describes numbness and at one point the feeling she was "walking on rocks". Current foot wear does not have a gel insole.  Review of Systems  Gastrointestinal:       Defers colonoscopy screening.       Objective:   Physical Exam  Constitutional: She appears well-developed and well-nourished. No distress.  Cardiovascular:  Pulses:      Dorsalis pedis pulses are 2+ on the right side, and 1+ on the left side.       Posterior tibial pulses are 2+ on the right side, and 1+ on the left side.  Skin:  Sensation to monofilament intact in both feet.  Ears: T.M's intact without inflammation Sinuses: non-tender Throat: no tonsillar enlargement or exudate Neck: no cervical adenopathy Lungs: clear     Assessment:    1. Acute recurrent sinusitis, unspecified location: add cefdinir  2. Paresthesias - Comprehensive metabolic panel - Hemoglobin A1c - TSH - T4, free  3. Screening for cholesterol level - Lipid panel    Plan:    Further f/u pending lab results.

## 2017-04-13 LAB — COMPREHENSIVE METABOLIC PANEL
A/G RATIO: 1.4 (ref 1.2–2.2)
ALT: 40 IU/L — AB (ref 0–32)
AST: 27 IU/L (ref 0–40)
Albumin: 4.6 g/dL (ref 3.5–5.5)
Alkaline Phosphatase: 109 IU/L (ref 39–117)
BUN/Creatinine Ratio: 15 (ref 9–23)
BUN: 13 mg/dL (ref 6–24)
Bilirubin Total: 0.3 mg/dL (ref 0.0–1.2)
CALCIUM: 9.8 mg/dL (ref 8.7–10.2)
CO2: 20 mmol/L (ref 20–29)
CREATININE: 0.87 mg/dL (ref 0.57–1.00)
Chloride: 103 mmol/L (ref 96–106)
GFR, EST AFRICAN AMERICAN: 90 mL/min/{1.73_m2} (ref >59)
GFR, EST NON AFRICAN AMERICAN: 78 mL/min/{1.73_m2} (ref >59)
GLOBULIN, TOTAL: 3.3 g/dL (ref 1.5–4.5)
Glucose: 104 mg/dL — ABNORMAL HIGH (ref 65–99)
POTASSIUM: 4.7 mmol/L (ref 3.5–5.2)
SODIUM: 143 mmol/L (ref 134–144)
TOTAL PROTEIN: 7.9 g/dL (ref 6.0–8.5)

## 2017-04-13 LAB — LIPID PANEL
CHOLESTEROL TOTAL: 202 mg/dL — AB (ref 100–199)
Chol/HDL Ratio: 3.7 ratio (ref 0.0–4.4)
HDL: 55 mg/dL (ref >39)
LDL CALC: 113 mg/dL — AB (ref 0–99)
Triglycerides: 168 mg/dL — ABNORMAL HIGH (ref 0–149)
VLDL CHOLESTEROL CAL: 34 mg/dL (ref 5–40)

## 2017-04-13 LAB — HEMOGLOBIN A1C
Est. average glucose Bld gHb Est-mCnc: 123 mg/dL
Hgb A1c MFr Bld: 5.9 % — ABNORMAL HIGH (ref 4.8–5.6)

## 2017-04-13 LAB — TSH: TSH: 3.64 u[IU]/mL (ref 0.450–4.500)

## 2017-04-13 LAB — T4, FREE: FREE T4: 0.84 ng/dL (ref 0.82–1.77)

## 2017-04-15 ENCOUNTER — Telehealth: Payer: Self-pay

## 2017-04-15 NOTE — Telephone Encounter (Signed)
Pt advised.  She states she will call back and schedule a follow up visit.   Thanks,   -Mickel Baas

## 2017-04-15 NOTE — Telephone Encounter (Signed)
-----   Message from Carmon Ginsberg, [place] sent at 04/15/2017  7:35 AM EST ----- Sugar is mildy elevated with A1C on "pre-diabetes" category. Thyroid is ok. Your cholesterol is mildly elevated. Your 10 year risk for developing cardiovascular disease is presently low at 1.2%. Would like to see you back in the office when your sinus infection has cleared to recheck your bp and further discuss lab results.

## 2017-04-18 ENCOUNTER — Telehealth: Payer: Self-pay | Admitting: Family Medicine

## 2017-04-18 ENCOUNTER — Other Ambulatory Visit: Payer: Self-pay | Admitting: Family Medicine

## 2017-04-18 MED ORDER — FLUCONAZOLE 150 MG PO TABS: 150.0000 mg | ORAL_TABLET | Freq: Once | ORAL | 0 refills | Status: AC

## 2017-04-18 NOTE — Telephone Encounter (Signed)
Medication sent in. 

## 2017-04-18 NOTE — Telephone Encounter (Signed)
Patient states that you put her on a antibiotic and she now has a yeast infection and would like to know if you will send in medication for this.  [facility] [place]

## 2017-04-18 NOTE — Telephone Encounter (Signed)
Please review. KW 

## 2017-05-08 ENCOUNTER — Telehealth: Payer: Self-pay | Admitting: Family Medicine

## 2017-05-08 NOTE — Telephone Encounter (Signed)
Pt is requesting call back to discuss what else she can try for the cough & congestion. Pt stated she had OV on 04/12/17 and took cefdinir (OMNICEF) 300 MG capsule that she finished about a week or so ago. Pt stated that she is still coughing and has congestion. [facility]. Please advise. Thanks TNP

## 2017-05-08 NOTE — Telephone Encounter (Signed)
Please review. Thanks!  

## 2017-05-09 ENCOUNTER — Ambulatory Visit: Payer: Self-pay | Admitting: Family Medicine

## 2017-05-09 NOTE — Telephone Encounter (Signed)
It has been nearly a month since I saw her so will need to see again.

## 2017-05-09 NOTE — Telephone Encounter (Signed)
Made appt for today at 4:30PM

## 2017-05-10 ENCOUNTER — Encounter: Payer: Self-pay | Admitting: Family Medicine

## 2017-05-10 ENCOUNTER — Ambulatory Visit (INDEPENDENT_AMBULATORY_CARE_PROVIDER_SITE_OTHER): Payer: BLUE CROSS/BLUE SHIELD | Admitting: Family Medicine

## 2017-05-10 VITALS — BP 124/85 | HR 74 | Temp 98.3°F | Resp 16 | Wt 202.4 lb

## 2017-05-10 DIAGNOSIS — J40 Bronchitis, not specified as acute or chronic: Secondary | ICD-10-CM

## 2017-05-10 MED ORDER — HYDROCODONE-HOMATROPINE 5-1.5 MG/5ML PO SYRP: ORAL_SOLUTION | ORAL | 0 refills | Status: DC

## 2017-05-10 NOTE — Progress Notes (Signed)
Subjective:     Patient ID: Virginia Martin, female   DOB: Dec 09, 1965, 52 y.o.   MRN: [id number] Chief Complaint  Patient presents with  . Cough    Patient comes in office today with complaints of cough and congestion for four weeks. Patient reports that she has been coughing so hard that she has been urinating on herself, patient reports shorntess of breath and low grade fever. Patient has tried otc Advil Sinus & Cold and Mucinex.    HPI States after course of cefdinir she no longer has PND or sinus congestion. Cough is minimally productive but she is experiencing chest tightness. She has been on a cruise to [place] since last office visit.  Review of Systems     Objective:   Physical Exam  Constitutional: She appears well-developed and well-nourished. No distress.  Ears: T.M's intact without inflammation Sinuses: non-tender Throat: no tonsillar enlargement or exudate Neck: no cervical adenopathy Lungs: clear     Assessment:    1. Bronchitis: r/o pneumonia - DG Chest 2 View; Future - HYDROcodone-homatropine (HYCODAN) 5-1.5 MG/5ML syrup; 5 ml 4-6 hours as needed for cough  Dispense: 120 mL; Refill: 0    Plan:    Further f/u pending x-ray results.

## 2017-05-10 NOTE — Patient Instructions (Signed)
Add Mucinex. We will call with the x-ray results.

## 2017-05-13 ENCOUNTER — Ambulatory Visit
Admission: RE | Admit: 2017-05-13 | Discharge: 2017-05-13 | Disposition: A | Discharge status: Home / Self Care | Payer: BLUE CROSS/BLUE SHIELD | Source: Ambulatory Visit | Attending: Family Medicine | Admitting: Family Medicine

## 2017-05-13 DIAGNOSIS — J40 Bronchitis, not specified as acute or chronic: Secondary | ICD-10-CM

## 2017-05-13 DIAGNOSIS — Q677 Pectus carinatum: Secondary | ICD-10-CM | POA: Insufficient documentation

## 2017-05-13 DIAGNOSIS — R05 Cough: Secondary | ICD-10-CM | POA: Insufficient documentation

## 2017-05-14 ENCOUNTER — Telehealth: Payer: Self-pay

## 2017-05-14 NOTE — Telephone Encounter (Signed)
Patient has been advised she states that she is doing well with cough medicine and it is helping. KW

## 2017-05-14 NOTE — Telephone Encounter (Signed)
-----   Message from Carmon Ginsberg, [place] sent at 05/14/2017  9:40 AM EST ----- No pneumonia. Is cough medication helping?

## 2017-05-14 NOTE — Telephone Encounter (Signed)
See below. KW 

## 2017-05-27 ENCOUNTER — Other Ambulatory Visit: Payer: Self-pay | Admitting: Family Medicine

## 2017-05-27 ENCOUNTER — Telehealth: Payer: Self-pay | Admitting: Family Medicine

## 2017-05-27 DIAGNOSIS — F419 Anxiety disorder, unspecified: Secondary | ICD-10-CM

## 2017-05-27 MED ORDER — VENLAFAXINE HCL ER 150 MG PO CP24: ORAL_CAPSULE | ORAL | 0 refills | Status: DC

## 2017-05-27 NOTE — Telephone Encounter (Signed)
[facility] request for a 90-days supply for the following medication. Thanks CC  venlafaxine XR (EFFEXOR-XR) 150 MG 24 hr capsule

## 2017-05-27 NOTE — Telephone Encounter (Signed)
Done

## 2017-05-27 NOTE — Telephone Encounter (Signed)
Patient of Bob's please review request. KW

## 2017-07-24 ENCOUNTER — Telehealth: Payer: Self-pay | Admitting: Family Medicine

## 2017-07-24 ENCOUNTER — Other Ambulatory Visit: Payer: Self-pay | Admitting: Family Medicine

## 2017-07-24 DIAGNOSIS — F410 Panic disorder [episodic paroxysmal anxiety] without agoraphobia: Secondary | ICD-10-CM

## 2017-07-24 MED ORDER — LORAZEPAM 1 MG PO TABS: ORAL_TABLET | ORAL | 5 refills | Status: DC

## 2017-07-24 NOTE — Telephone Encounter (Signed)
done

## 2017-07-24 NOTE — Telephone Encounter (Signed)
Needs refill on her lorazepam 1mg    [facility] [place]  Pt's call back is [phone]  Thanks Con Memos

## 2017-07-24 NOTE — Telephone Encounter (Signed)
Last filled 03/08/17, please review. KW

## 2017-08-27 ENCOUNTER — Other Ambulatory Visit: Payer: Self-pay | Admitting: Family Medicine

## 2017-08-27 DIAGNOSIS — F419 Anxiety disorder, unspecified: Secondary | ICD-10-CM

## 2017-11-05 ENCOUNTER — Ambulatory Visit (INDEPENDENT_AMBULATORY_CARE_PROVIDER_SITE_OTHER): Payer: BLUE CROSS/BLUE SHIELD | Admitting: Family Medicine

## 2017-11-05 ENCOUNTER — Encounter: Payer: Self-pay | Admitting: Family Medicine

## 2017-11-05 VITALS — BP 124/92 | HR 72 | Temp 98.6°F | Resp 16 | Wt 200.2 lb

## 2017-11-05 DIAGNOSIS — R350 Frequency of micturition: Secondary | ICD-10-CM

## 2017-11-05 DIAGNOSIS — M545 Low back pain, unspecified: Secondary | ICD-10-CM

## 2017-11-05 LAB — POCT URINALYSIS DIPSTICK
Glucose, UA: NEGATIVE
Ketones, UA: NEGATIVE
Leukocytes, UA: NEGATIVE
Nitrite, UA: NEGATIVE
PH UA: 6 (ref 5.0–8.0)
PROTEIN UA: POSITIVE — AB
RBC UA: NEGATIVE
Spec Grav, UA: 1.015 (ref 1.010–1.025)
UROBILINOGEN UA: 0.2 U/dL

## 2017-11-05 LAB — GLUCOSE, POCT (MANUAL RESULT ENTRY): POC Glucose: 148 mg/dl — AB (ref 70–99)

## 2017-11-05 MED ORDER — CYCLOBENZAPRINE HCL 5 MG PO TABS: 5.0000 mg | ORAL_TABLET | Freq: Three times a day (TID) | ORAL | 0 refills | Status: DC | PRN

## 2017-11-05 NOTE — Patient Instructions (Addendum)
Hold caffeine.. Take ibuprofen 800 mg. 3 x day with meals. Let me know over the next 24-48 hours if urinary symptoms improving.

## 2017-11-05 NOTE — Progress Notes (Signed)
  Subjective:     Patient ID: Virginia Martin, female   DOB: 12-05-1965, 52 y.o.   MRN: [id number] Chief Complaint  Patient presents with  . Urinary Frequency    Patient comes in office today with wcomplaints of urinary frequency, difficulty emptying bladder, back pain and pelvic pressure for over two weeks.    HPI States she is getting up 2-3 x at night to urinate. Has been taking ibuprofen 800 mg every 6 hours. Current caffeine consumption is 20 oz of coffee and 20 of soft drink daily. No hx of diverticulitis or colonoscopy. Review of Systems     Objective:   Physical Exam  Constitutional: She appears well-developed and well-nourished. No distress.  Abdominal: Soft. Bowel sounds are normal. There is no tenderness.  Musculoskeletal:  Muscle strength in lower extremities 5/5. SLR's to 90 degrees without radiation of back pain.. Localizes to left lower back area which is mildly tender to the touch. Mild discomfort when changing positions.       Assessment:    1. Urinary frequency - POCT urinalysis dipstick - POCT glucose (manual entry) (post prandial)  2. Acute left-sided low back pain without sciatica - cyclobenzaprine (FLEXERIL) 5 MG tablet; Take 1 tablet (5 mg total) by mouth 3 (three) times daily as needed for muscle spasms.  Dispense: 21 tablet; Refill: 0    Plan:    Discussed taking ibuprofen 800 mg 3 x day with meals. Stop caffeine for now. If further urinary sx to call in the next 24-48 hours.

## 2017-12-02 ENCOUNTER — Telehealth: Payer: Self-pay | Admitting: Family Medicine

## 2017-12-02 NOTE — Telephone Encounter (Signed)
1. Pt contacted office for refill request on the following medications:  LORazepam (ATIVAN) 1 MG tablet  [facility]  2. Pt also request Kat to return her call. Pt wouldn't give any other details than she needs to ask [name] a question. Please advise. Thanks TNP

## 2017-12-03 ENCOUNTER — Other Ambulatory Visit: Payer: Self-pay | Admitting: Family Medicine

## 2017-12-03 DIAGNOSIS — F410 Panic disorder [episodic paroxysmal anxiety] without agoraphobia: Secondary | ICD-10-CM

## 2017-12-03 MED ORDER — LORAZEPAM 1 MG PO TABS: ORAL_TABLET | ORAL | 5 refills | Status: DC

## 2017-12-03 NOTE — Telephone Encounter (Signed)
Spoke with patient she had question on what medication to take because she pulled her back while getting off toilet the other day. I advised patient that If she felt that she pulled her back again she could take a antiinflammatory drug such as a Nsaid to see if it helps with pain, I also advised patient that if you still had her active prescription for Flexeril she could take that along with Meloxicam. I advised that if she did not have any relief and pain persisted than it would be best to come in office for evaluation. KW

## 2017-12-03 NOTE — Telephone Encounter (Signed)
Medication refilled. Please see request to return her call.

## 2017-12-03 NOTE — Telephone Encounter (Signed)
Medication last filled 07/24/17

## 2017-12-07 ENCOUNTER — Emergency Department: Payer: BLUE CROSS/BLUE SHIELD

## 2017-12-07 ENCOUNTER — Emergency Department
Admission: EM | Admit: 2017-12-07 | Discharge: 2017-12-07 | Disposition: A | Discharge status: Home / Self Care | Payer: BLUE CROSS/BLUE SHIELD | Attending: Emergency Medicine | Admitting: Emergency Medicine

## 2017-12-07 ENCOUNTER — Encounter: Payer: Self-pay | Admitting: Emergency Medicine

## 2017-12-07 DIAGNOSIS — M545 Low back pain: Secondary | ICD-10-CM | POA: Diagnosis present

## 2017-12-07 DIAGNOSIS — M5431 Sciatica, right side: Secondary | ICD-10-CM | POA: Insufficient documentation

## 2017-12-07 DIAGNOSIS — Z79899 Other long term (current) drug therapy: Secondary | ICD-10-CM | POA: Insufficient documentation

## 2017-12-07 DIAGNOSIS — E039 Hypothyroidism, unspecified: Secondary | ICD-10-CM | POA: Diagnosis not present

## 2017-12-07 MED ORDER — IBUPROFEN 600 MG PO TABS: 600.0000 mg | ORAL_TABLET | Freq: Four times a day (QID) | ORAL | 0 refills | Status: DC | PRN

## 2017-12-07 MED ORDER — PREDNISONE 10 MG PO TABS: ORAL_TABLET | ORAL | 0 refills | Status: DC

## 2017-12-07 MED ORDER — ORPHENADRINE CITRATE 30 MG/ML IJ SOLN
60.0000 mg | Freq: Two times a day (BID) | INTRAMUSCULAR | Status: DC
  Administered 2017-12-07: 60 mg via INTRAMUSCULAR
  Filled 2017-12-07: qty 2

## 2017-12-07 MED ORDER — KETOROLAC TROMETHAMINE 30 MG/ML IJ SOLN
30.0000 mg | Freq: Once | INTRAMUSCULAR | Status: AC
  Administered 2017-12-07: 30 mg via INTRAMUSCULAR
  Filled 2017-12-07: qty 1

## 2017-12-07 MED ORDER — OXYCODONE-ACETAMINOPHEN 5-325 MG PO TABS: 1.0000 | ORAL_TABLET | Freq: Once | ORAL | Status: DC

## 2017-12-07 MED ORDER — OXYCODONE-ACETAMINOPHEN 5-325 MG PO TABS: 1.0000 | ORAL_TABLET | ORAL | 0 refills | Status: DC | PRN

## 2017-12-07 NOTE — ED Provider Notes (Signed)
[facility] Emergency Department Provider Note  ____________________________________________  Time seen: Approximately 3:06 PM  I have reviewed the triage vital signs and the nursing notes.   HISTORY  Chief Complaint Back Pain    HPI Virginia Martin is a 52 y.o. female that presents emergency department for evaluation of low back pain for 1 week.  Patient was getting off of the toilet at church when she felt a sharp pain in the center of her back.  Sharp pain in her back has resolved.  Now she has pain that starts on the right side of her back and radiates down into her back leg.  Pain stops at her knee.  No bowel or bladder dysfunction or saddle anesthesias.  No nausea, vomiting, abdominal pain, any urinary symptoms.  Past Medical History:  Diagnosis Date  . Cancer ([place])   . Depression   . Thyroid disease     Patient Active Problem List   Diagnosis Date Noted  . Depression 02/16/2015  . Panic attacks 02/16/2015  . H/O: hypothyroidism 02/16/2015    Past Surgical History:  Procedure Laterality Date  . ABDOMINAL HYSTERECTOMY  2002  . APPENDECTOMY  2002  . BREAST BIOPSY  2006  . INCISION AND DRAINAGE ABSCESS N/A 07/25/2015   Procedure: INCISION AND DRAINAGE ABSCESS / GLUTEAL;  Surgeon: Clayburn Pert, MD;  Location: [facility] ORS;  Service: General;  Laterality: N/A;  . WRIST SURGERY Left [phone]    Prior to Admission medications   Medication Sig Start Date End Date Taking? Authorizing Provider  cyclobenzaprine (FLEXERIL) 5 MG tablet Take 1 tablet (5 mg total) by mouth 3 (three) times daily as needed for muscle spasms. 11/05/17   Carmon Ginsberg, PA  ibuprofen (ADVIL,MOTRIN) 600 MG tablet Take 1 tablet (600 mg total) by mouth every 6 (six) hours as needed. 12/07/17   Laban Emperor, PA-C  LORazepam (ATIVAN) 1 MG tablet TAKE 1 TABLET BY MOUTH AT BEDTIME AND 1/2 TAB EVERY 6 HOURS AS NEEDED FOR PANIC ATTACK 12/03/17   Carmon Ginsberg, PA  meloxicam (MOBIC) 15 MG  tablet  11/25/15   [provider]  oxyCODONE-acetaminophen (PERCOCET) 5-325 MG tablet Take 1 tablet by mouth every 4 (four) hours as needed for severe pain. 12/07/17 12/07/18  Laban Emperor, PA-C  predniSONE (DELTASONE) 10 MG tablet Take 6 tablets on day 1, take 5 tablets on day 2, take 4 tablets on day 3, take 3 tablets on day 4, take 2 tablets on day 5, take 1 tablet on day 6 12/07/17   Laban Emperor, PA-C  venlafaxine XR (EFFEXOR-XR) 150 MG 24 hr capsule TAKE 1 CAPSULE (150 MG TOTAL) BY MOUTH DAILY WITH BREAKFAST. 08/27/17   Carmon Ginsberg, PA    Allergies Clindamycin hcl; Codeine; Amoxicillin; Diphenhydramine hcl; and Gabapentin  Family History  Problem Relation Age of Onset  . Depression Mother   . Colon polyps Mother   . Heart attack Father   . Hypertension Father   . Diabetes Father   . Depression Sister   . Diabetes Maternal Grandmother   . Cancer Maternal Grandmother   . COPD Paternal Grandmother     Social History Social History   Tobacco Use  . Smoking status: Never Smoker  . Smokeless tobacco: Never Used  Substance Use Topics  . Alcohol use: No    Alcohol/week: 0.0 standard drinks  . Drug use: No     Review of Systems  Constitutional: No fever/chills Cardiovascular: No chest pain. Respiratory: No SOB. Gastrointestinal: No  abdominal pain.  No nausea, no vomiting.  Musculoskeletal: Positive for back pain.  Skin: Negative for rash, abrasions, lacerations, ecchymosis. Neurological: Negative for  numbness or tingling   ____________________________________________   PHYSICAL EXAM:  VITAL SIGNS: ED Triage Vitals  Enc Vitals Group     BP 12/07/17 1433 (!) 146/95     Pulse Rate 12/07/17 1433 94     Resp 12/07/17 1433 16     Temp 12/07/17 1433 97.8 F (36.6 C)     Temp Source 12/07/17 1433 Oral     SpO2 12/07/17 1433 98 %     Weight 12/07/17 1432 200 lb 2.8 oz (90.8 kg)     Height --      Head Circumference --      Peak Flow --      Pain Score  12/07/17 1432 8     Pain Loc --      Pain Edu? --      Excl. in [place]? --      Constitutional: Alert and oriented. Well appearing and in no acute distress. Eyes: Conjunctivae are normal. PERRL. EOMI. Head: Atraumatic. ENT:      Ears:      Nose: No congestion/rhinnorhea.      Mouth/Throat: Mucous membranes are moist.  Neck: No stridor.  Cardiovascular: Normal rate, regular rhythm.  Good peripheral circulation. Respiratory: Normal respiratory effort without tachypnea or retractions. Lungs CTAB. Good air entry to the bases with no decreased or absent breath sounds. Gastrointestinal: Bowel sounds 4 quadrants. Soft and nontender to palpation. No guarding or rigidity. No palpable masses. No distention. Musculoskeletal: Full range of motion to all extremities. No gross deformities appreciated.  Positive straight leg raise. Strength equal in lower extremities.  No foot drop. Neurologic:  Normal speech and language. No gross focal neurologic deficits are appreciated.  Skin:  Skin is warm, dry and intact. No rash noted. Psychiatric: Mood and affect are normal. Speech and behavior are normal. Patient exhibits appropriate insight and judgement.   ____________________________________________   LABS (all labs ordered are listed, but only abnormal results are displayed)  Labs Reviewed - No data to display ____________________________________________  EKG   ____________________________________________  RADIOLOGY   Dg Lumbar Spine 2-3 Views  Result Date: 12/07/2017 CLINICAL DATA:  Low back pain for the past week. EXAM: LUMBAR SPINE - 2-3 VIEW COMPARISON:  CT abdomen pelvis dated February 21, 2006. FINDINGS: Five lumbar type vertebral bodies. No acute fracture or subluxation. Vertebral body heights are preserved. Alignment is normal. Scattered anterior endplate spurring throughout the lumbar spine. Mild disc height loss at L2-L3. Moderate disc height loss at T10-T11. The sacroiliac joints are  unremarkable. IMPRESSION: 1.  No acute osseous abnormality. 2. Mild thoracolumbar spondylosis. Electronically Signed   By: Titus Dubin M.D.   On: 12/07/2017 15:19    ____________________________________________    PROCEDURES  Procedure(s) performed:    Procedures    Medications  orphenadrine (NORFLEX) injection 60 mg (60 mg Intramuscular Given 12/07/17 1526)  oxyCODONE-acetaminophen (PERCOCET/ROXICET) 5-325 MG per tablet 1 tablet (has no administration in time range)  ketorolac (TORADOL) 30 MG/ML injection 30 mg (30 mg Intramuscular Given 12/07/17 1526)     ____________________________________________   INITIAL IMPRESSION / ASSESSMENT AND PLAN / ED COURSE  Pertinent labs & imaging results that were available during my care of the patient were reviewed by me and considered in my medical decision making (see chart for details).  Review of the [place] CSRS was performed in accordance of the  [facility] prior to dispensing any controlled drugs.     Patient's diagnosis is consistent with sciatica.  Vital signs and exam are reassuring.  X-ray consistent with arthritis.  Patient will be discharged home with prescriptions for prednisone, ibuprofen, a short course of Percocet. Patient is to follow up with PCP and Ortho as directed. Patient is given ED precautions to return to the ED for any worsening or new symptoms.     ____________________________________________  FINAL CLINICAL IMPRESSION(S) / ED DIAGNOSES  Final diagnoses:  Sciatica of right side      NEW MEDICATIONS STARTED DURING THIS VISIT:  ED Discharge Orders         Ordered    predniSONE (DELTASONE) 10 MG tablet     12/07/17 1626    ibuprofen (ADVIL,MOTRIN) 600 MG tablet  Every 6 hours PRN     12/07/17 1626    oxyCODONE-acetaminophen (PERCOCET) 5-325 MG tablet  Every 4 hours PRN     12/07/17 1626              This chart was dictated using voice recognition software/Dragon. Despite best efforts to proofread,  errors can occur which can change the meaning. Any change was purely unintentional.    Laban Emperor, PA-C 12/08/17 1005    Nena Polio, MD 12/16/17 [phone]

## 2017-12-07 NOTE — ED Triage Notes (Signed)
Low back pain x 1 week.  States initially injured back last Sunday at church, felt a "catch".  C/O low back pain since.  Taking flexeril and ES Tylenol, with no relief.  Pain radiates down left leg.

## 2017-12-12 ENCOUNTER — Encounter: Payer: Self-pay | Admitting: Family Medicine

## 2017-12-12 ENCOUNTER — Ambulatory Visit (INDEPENDENT_AMBULATORY_CARE_PROVIDER_SITE_OTHER): Payer: Self-pay | Admitting: Family Medicine

## 2017-12-12 VITALS — BP 132/90 | HR 76 | Temp 98.5°F | Resp 16 | Wt 200.4 lb

## 2017-12-12 DIAGNOSIS — M545 Low back pain, unspecified: Secondary | ICD-10-CM

## 2017-12-12 MED ORDER — CYCLOBENZAPRINE HCL 5 MG PO TABS: 5.0000 mg | ORAL_TABLET | Freq: Three times a day (TID) | ORAL | 0 refills | Status: DC | PRN

## 2017-12-12 MED ORDER — OXYCODONE-ACETAMINOPHEN 5-325 MG PO TABS: 1.0000 | ORAL_TABLET | Freq: Four times a day (QID) | ORAL | 0 refills | Status: DC | PRN

## 2017-12-12 NOTE — Progress Notes (Signed)
  Subjective:     Patient ID: Virginia Martin, female   DOB: 02-Mar-1966, 52 y.o.   MRN: [id number] Chief Complaint  Patient presents with  . Back Pain    Patient returns back to office to address left sided pain, patient was last seen on 11/05/17 and started on Flexeril 5mg . Patient reports that she was seen at ED on 12/07/17 with back pain she states she was diagnosed with sciatica and told to f/u with ortho. Patient states that she was prescribed prednisone, ibuprofen and percocet. Patient reports spasms described as shooting and burning, she states pain has not improved and affects her daily routines. Patient has applied lidocaine patches & heat to back   HPI States her back primarily burns when she has been up for a while. Gets some relief with ibuprofen, lidocaine patch and muscle relaxant. Originally seen for left low back pain 11/05/17 and may have reinjured it 11 days ago. Has continued to go to work but has avoided lifting. Reports remote hx of thoracic ESI per pain clinic. ER lumbar x-ray with mild spondylosis. Review of Systems     Objective:   Physical Exam  Constitutional: She appears well-developed and well-nourished. No distress.  Musculoskeletal:  Muscle strength in lower extremities 5/5. SLR's to 90 degrees without radiation of back pain. Tender in her left SI area. No ischial tenderness.       Assessment:    1. Acute left-sided low back pain without sciatica - cyclobenzaprine (FLEXERIL) 5 MG tablet; Take 1 tablet (5 mg total) by mouth 3 (three) times daily as needed for muscle spasms.  Dispense: 21 tablet; Refill: 0 - oxyCODONE-acetaminophen (PERCOCET) 5-325 MG tablet; Take 1 tablet by mouth every 6 (six) hours as needed for severe pain.  Dispense: 20 tablet; Refill: 0    Plan:   Continue ibuprofen. If not continuing to improve over the next week call for referral.

## 2017-12-12 NOTE — Patient Instructions (Signed)
Continue ibuprofen 600 mg every 6 hours as needed. Let me know if not improving over another week.

## 2017-12-19 ENCOUNTER — Telehealth: Payer: Self-pay | Admitting: Family Medicine

## 2017-12-19 ENCOUNTER — Other Ambulatory Visit: Payer: Self-pay | Admitting: Family Medicine

## 2017-12-19 DIAGNOSIS — M545 Low back pain, unspecified: Secondary | ICD-10-CM

## 2017-12-19 NOTE — Telephone Encounter (Signed)
Patient reports that pain is still persistent in her back and lower bottom. Patient states that she takes muscle relaxants which helps some and Percocet for pain but it does not help get rid of the pain. Patient states walking up the stairs today she was in tears and wanted to know if there is anything else you can do for her such as a MRI? Or referral to Ortho? Please advise. KW

## 2017-12-19 NOTE — Telephone Encounter (Signed)
Referral back to [facility] clinic orthopedics in progress.

## 2017-12-19 NOTE — Telephone Encounter (Signed)
Pt would like call back regarding back problems. States she was in a week or two ago with the same problem.

## 2017-12-20 ENCOUNTER — Telehealth: Payer: Self-pay | Admitting: Family Medicine

## 2017-12-20 NOTE — Telephone Encounter (Signed)
Left detailed message for patient notifying her that referral has been placed for Ortho for further evaluation.

## 2017-12-20 NOTE — Telephone Encounter (Signed)
Pt called saying she spoke with you yesterday about her back pain and was waiting for you to talk to bob and find out what he wants to do.    Pt's CB# is [phone]  Thanks  Con Memos

## 2018-01-14 ENCOUNTER — Other Ambulatory Visit: Payer: Self-pay | Admitting: Family Medicine

## 2018-01-14 ENCOUNTER — Telehealth: Payer: Self-pay | Admitting: Family Medicine

## 2018-01-14 DIAGNOSIS — M545 Low back pain, unspecified: Secondary | ICD-10-CM

## 2018-01-14 MED ORDER — CYCLOBENZAPRINE HCL 5 MG PO TABS: 5.0000 mg | ORAL_TABLET | Freq: Three times a day (TID) | ORAL | 0 refills | Status: DC | PRN

## 2018-01-14 MED ORDER — OXYCODONE-ACETAMINOPHEN 5-325 MG PO TABS: 1.0000 | ORAL_TABLET | Freq: Four times a day (QID) | ORAL | 0 refills | Status: DC | PRN

## 2018-01-14 NOTE — Telephone Encounter (Signed)
Please advise. KW 

## 2018-01-14 NOTE — Telephone Encounter (Signed)
Pt is requesting refill for cyclobenzaprine (FLEXERIL) 5 MG tablet 2)oxyCODONE-acetaminophen (PERCOCET) 5-325 MG tablet.She would like [facility] sent to  [id number] [place], [place] - [address] [phone] (Phone) [phone] (Fax)  Until she can get in with orthopaedic doctor for back pain.She will pick up if can not be called in

## 2018-01-14 NOTE — Telephone Encounter (Signed)
Oxycodone rx up front for pickup. Flexeril sent to the pharmacy. When is the orthopedic appointment?

## 2018-01-14 NOTE — Telephone Encounter (Signed)
Patient advised, she states that our office called her today to help her set up appt with [facility]. No confirmed appt date and time yet per patient. KW

## 2018-01-21 ENCOUNTER — Other Ambulatory Visit: Payer: Self-pay

## 2018-01-21 ENCOUNTER — Emergency Department
Admission: EM | Admit: 2018-01-21 | Discharge: 2018-01-21 | Disposition: A | Discharge status: Home / Self Care | Payer: Self-pay | Attending: Student in an Organized Health Care Education/Training Program | Admitting: Student in an Organized Health Care Education/Training Program

## 2018-01-21 ENCOUNTER — Encounter: Payer: Self-pay | Admitting: Emergency Medicine

## 2018-01-21 ENCOUNTER — Emergency Department: Payer: Self-pay

## 2018-01-21 DIAGNOSIS — Y999 Unspecified external cause status: Secondary | ICD-10-CM | POA: Insufficient documentation

## 2018-01-21 DIAGNOSIS — W0110XA Fall on same level from slipping, tripping and stumbling with subsequent striking against unspecified object, initial encounter: Secondary | ICD-10-CM | POA: Insufficient documentation

## 2018-01-21 DIAGNOSIS — E039 Hypothyroidism, unspecified: Secondary | ICD-10-CM | POA: Insufficient documentation

## 2018-01-21 DIAGNOSIS — Z79899 Other long term (current) drug therapy: Secondary | ICD-10-CM | POA: Insufficient documentation

## 2018-01-21 DIAGNOSIS — Y9301 Activity, walking, marching and hiking: Secondary | ICD-10-CM | POA: Insufficient documentation

## 2018-01-21 DIAGNOSIS — S63502A Unspecified sprain of left wrist, initial encounter: Secondary | ICD-10-CM | POA: Insufficient documentation

## 2018-01-21 DIAGNOSIS — Y929 Unspecified place or not applicable: Secondary | ICD-10-CM | POA: Insufficient documentation

## 2018-01-21 DIAGNOSIS — Z859 Personal history of malignant neoplasm, unspecified: Secondary | ICD-10-CM | POA: Insufficient documentation

## 2018-01-21 MED ORDER — NABUMETONE 750 MG PO TABS: 750.0000 mg | ORAL_TABLET | Freq: Two times a day (BID) | ORAL | 0 refills | Status: AC

## 2018-01-21 NOTE — ED Provider Notes (Signed)
[facility] Emergency Department Provider Note ____________________________________________  Time seen: 2305  I have reviewed the triage vital signs and the nursing notes.  HISTORY  Chief Complaint  Wrist Pain  HPI Virginia Martin is a 52 y.o.right-hande female who presents to the ED for evaluation of left wrist pain.  Patient describes a mechanical fall, she was walking up towards a tree to investigate a presumed rabid raccoon.  She describes slipping on wet underbrush, landing backwards on outstretched left hand.  Since that time she said pain and swelling to the dorsal aspect of the left wrist.  Of note is a remote medical history for previous ORIF to the left wrist some 20+ years prior.  She subsequently had the hardware removed about a year after the initial procedure.  She presents now noting increasing pain in occult he with range of the left wrist as the day as preceded.  She describes taking Tylenol about every 4 hours since the onset of the injury.  She denies any other injury at this time.  Past Medical History:  Diagnosis Date  . Cancer ([place])   . Depression   . Thyroid disease     Patient Active Problem List   Diagnosis Date Noted  . Depression 02/16/2015  . Panic attacks 02/16/2015  . H/O: hypothyroidism 02/16/2015    Past Surgical History:  Procedure Laterality Date  . ABDOMINAL HYSTERECTOMY  2002  . APPENDECTOMY  2002  . BREAST BIOPSY  2006  . INCISION AND DRAINAGE ABSCESS N/A 07/25/2015   Procedure: INCISION AND DRAINAGE ABSCESS / GLUTEAL;  Surgeon: Clayburn Pert, MD;  Location: [facility] ORS;  Service: General;  Laterality: N/A;  . WRIST SURGERY Left [phone]    Prior to Admission medications   Medication Sig Start Date End Date Taking? Authorizing Provider  cyclobenzaprine (FLEXERIL) 5 MG tablet Take 1 tablet (5 mg total) by mouth 3 (three) times daily as needed for muscle spasms. 01/14/18   Carmon Ginsberg, PA  ibuprofen (ADVIL,MOTRIN) 600  MG tablet Take 1 tablet (600 mg total) by mouth every 6 (six) hours as needed. 12/07/17   Laban Emperor, PA-C  LORazepam (ATIVAN) 1 MG tablet TAKE 1 TABLET BY MOUTH AT BEDTIME AND 1/2 TAB EVERY 6 HOURS AS NEEDED FOR PANIC ATTACK 12/03/17   Carmon Ginsberg, PA  meloxicam (MOBIC) 15 MG tablet  11/25/15   [provider]  nabumetone (RELAFEN) 750 MG tablet Take 1 tablet (750 mg total) by mouth 2 (two) times daily for 15 days. 01/21/18 02/05/18  Samba Cumba, Dannielle Karvonen, PA-C  oxyCODONE-acetaminophen (PERCOCET) 5-325 MG tablet Take 1 tablet by mouth every 6 (six) hours as needed for severe pain. 01/14/18 01/14/19  Carmon Ginsberg, PA  predniSONE (DELTASONE) 10 MG tablet Take 6 tablets on day 1, take 5 tablets on day 2, take 4 tablets on day 3, take 3 tablets on day 4, take 2 tablets on day 5, take 1 tablet on day 6 12/07/17   Laban Emperor, PA-C  venlafaxine XR (EFFEXOR-XR) 150 MG 24 hr capsule TAKE 1 CAPSULE (150 MG TOTAL) BY MOUTH DAILY WITH BREAKFAST. 08/27/17   Carmon Ginsberg, PA    Allergies Clindamycin hcl; Codeine; Amoxicillin; Diphenhydramine hcl; and Gabapentin  Family History  Problem Relation Age of Onset  . Depression Mother   . Colon polyps Mother   . Heart attack Father   . Hypertension Father   . Diabetes Father   . Depression Sister   . Diabetes Maternal Grandmother   .  Cancer Maternal Grandmother   . COPD Paternal Grandmother     Social History Social History   Tobacco Use  . Smoking status: Never Smoker  . Smokeless tobacco: Never Used  Substance Use Topics  . Alcohol use: No    Alcohol/week: 0.0 standard drinks  . Drug use: No    Review of Systems  Constitutional: Negative for fever. Cardiovascular: Negative for chest pain. Respiratory: Negative for shortness of breath. Musculoskeletal: Negative for back pain.  Left wrist pain as above. Skin: Negative for rash. Neurological: Negative for headaches, focal weakness or  numbness. ____________________________________________  PHYSICAL EXAM:  VITAL SIGNS: ED Triage Vitals  Enc Vitals Group     BP 01/21/18 2219 (!) 161/78     Pulse Rate 01/21/18 2219 78     Resp 01/21/18 2219 19     Temp 01/21/18 2219 98.1 F (36.7 C)     Temp Source 01/21/18 2219 Oral     SpO2 01/21/18 2219 98 %     Weight 01/21/18 2230 200 lb (90.7 kg)     Height 01/21/18 2230 5\' 4"  (1.626 m)     Head Circumference --      Peak Flow --      Pain Score 01/21/18 2229 10     Pain Loc --      Pain Edu? --      Excl. in [place]? --     Constitutional: Alert and oriented. Well appearing and in no distress. Head: Normocephalic and atraumatic. Eyes: Conjunctivae are normal. Normal extraocular movements Neck: Supple. Normal ROM Cardiovascular: Normal rate, regular rhythm. Normal distal pulses. Respiratory: Normal respiratory effort. No wheezes/rales/rhonchi. Musculoskeletal: Left wrist without obvious deformity, dislocation, or joint effusion.  Patient with normal composite fist on the left hand.  She is able to demonstrate normal pronation and supination range.  Mildly tender to palpation over the dorsal aspect of the distal wrist.  Nontender with normal range of motion in all extremities.  Neurologic: Cranial nerves II through XII grossly intact.  Normal gross sensation.  Normal intrinsic and opposition test normal speech and language. No gross focal neurologic deficits are appreciated. Skin:  Skin is warm, dry and intact. No rash noted. ____________________________________________   RADIOLOGY  Left Wrist  Negative ____________________________________________  PROCEDURES  Procedures Velcro wrist cock-up splint ____________________________________________  INITIAL IMPRESSION / ASSESSMENT AND PLAN / ED COURSE  Patient with ED evaluation of injury sustained following a mechanical fall.  Patient's primary complaint is left wrist pain.  Her x-rays negative for any acute fracture or  dislocation.  Her exam is overall benign, with symptoms likely due to a mild wrist sprain.  She is placed in appropriate Velcro splint, and referred to orthopedics for further evaluation and management.  She is discharged with a prescription for Relafen to dose twice daily for anti-inflammatory benefit. ____________________________________________  FINAL CLINICAL IMPRESSION(S) / ED DIAGNOSES  Final diagnoses:  Sprain of left wrist, initial encounter      Melvenia Needles, PA-C 01/21/18 2355    Merlyn Lot, MD 01/24/18 [phone]

## 2018-01-21 NOTE — ED Triage Notes (Signed)
Pt reports that she fell earlier this am and injured her left wrist. Wrist is swollen.

## 2018-01-21 NOTE — Discharge Instructions (Addendum)
Your exam and x-rays are negative for fracture or dislocation. Take the prescription anti-inflammatory as directed. Apply ice to reduce pain and swelling. Follow-up with Dr. Roland Rack as needed.

## 2018-02-25 ENCOUNTER — Other Ambulatory Visit: Payer: Self-pay | Admitting: Family Medicine

## 2018-02-25 DIAGNOSIS — F419 Anxiety disorder, unspecified: Secondary | ICD-10-CM

## 2018-04-03 ENCOUNTER — Other Ambulatory Visit: Payer: Self-pay | Admitting: Family Medicine

## 2018-04-03 DIAGNOSIS — F410 Panic disorder [episodic paroxysmal anxiety] without agoraphobia: Secondary | ICD-10-CM

## 2018-04-03 MED ORDER — LORAZEPAM 1 MG PO TABS: ORAL_TABLET | ORAL | 5 refills | Status: DC

## 2018-04-03 NOTE — Telephone Encounter (Signed)
Pt needing a refill on: LORazepam (ATIVAN) 1 MG tablet  Please fill at:   [id number] - [place], [place] - [address] [phone] (Phone) [phone] (Fax)   Thanks, [employer]

## 2018-04-10 ENCOUNTER — Ambulatory Visit: Payer: Self-pay | Admitting: Family Medicine

## 2018-07-17 ENCOUNTER — Other Ambulatory Visit: Payer: Self-pay | Admitting: Family Medicine

## 2018-07-17 DIAGNOSIS — F419 Anxiety disorder, unspecified: Secondary | ICD-10-CM

## 2018-07-17 DIAGNOSIS — F410 Panic disorder [episodic paroxysmal anxiety] without agoraphobia: Secondary | ICD-10-CM

## 2018-07-17 NOTE — Telephone Encounter (Signed)
This was Bob's patient. Last office visit with Mikki Santee was 12/12/2017, but that was an  acute visit for low back pain.  Please advise on refill request.

## 2018-07-17 NOTE — Telephone Encounter (Signed)
Pt needs a refill on  Lorazepam 1 mg  Venlafaxine XR   [facility]

## 2018-07-18 MED ORDER — VENLAFAXINE HCL ER 150 MG PO CP24: ORAL_CAPSULE | ORAL | 1 refills | Status: DC

## 2018-07-18 MED ORDER — LORAZEPAM 1 MG PO TABS: ORAL_TABLET | ORAL | 2 refills | Status: DC

## 2018-08-05 ENCOUNTER — Telehealth: Payer: Self-pay | Admitting: Physician Assistant

## 2018-08-05 ENCOUNTER — Ambulatory Visit (INDEPENDENT_AMBULATORY_CARE_PROVIDER_SITE_OTHER): Payer: 59 | Admitting: Physician Assistant

## 2018-08-05 DIAGNOSIS — F410 Panic disorder [episodic paroxysmal anxiety] without agoraphobia: Secondary | ICD-10-CM | POA: Diagnosis not present

## 2018-08-05 DIAGNOSIS — J0110 Acute frontal sinusitis, unspecified: Secondary | ICD-10-CM

## 2018-08-05 DIAGNOSIS — F32A Depression, unspecified: Secondary | ICD-10-CM

## 2018-08-05 DIAGNOSIS — F329 Major depressive disorder, single episode, unspecified: Secondary | ICD-10-CM

## 2018-08-05 MED ORDER — DOXYCYCLINE HYCLATE 100 MG PO TABS: 100.0000 mg | ORAL_TABLET | Freq: Two times a day (BID) | ORAL | 0 refills | Status: AC

## 2018-08-05 NOTE — Telephone Encounter (Signed)
Can we call for 4 month CPE and follow up please? Getting my template built.
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
## 2018-08-05 NOTE — Patient Instructions (Signed)
Take allegra, zyrtec, claritin or xyzal   Sinusitis, Adult Sinusitis is soreness and swelling (inflammation) of your sinuses. Sinuses are hollow spaces in the bones around your face. They are located:  Around your eyes.  In the middle of your forehead.  Behind your nose.  In your cheekbones. Your sinuses and nasal passages are lined with a fluid called mucus. Mucus drains out of your sinuses. Swelling can trap mucus in your sinuses. This lets germs (bacteria, virus, or fungus) grow, which leads to infection. Most of the time, this condition is caused by a virus. What are the causes? This condition is caused by:  Allergies.  Asthma.  Germs.  Things that block your nose or sinuses.  Growths in the nose (nasal polyps).  Chemicals or irritants in the air.  Fungus (rare). What increases the risk? You are more likely to develop this condition if:  You have a weak body defense system (immune system).  You do a lot of swimming or diving.  You use nasal sprays too much.  You smoke. What are the signs or symptoms? The main symptoms of this condition are pain and a feeling of pressure around the sinuses. Other symptoms include:  Stuffy nose (congestion).  Runny nose (drainage).  Swelling and warmth in the sinuses.  Headache.  Toothache.  A cough that may get worse at night.  Mucus that collects in the throat or the back of the nose (postnasal drip).  Being unable to smell and taste.  Being very tired (fatigue).  A fever.  Sore throat.  Bad breath. How is this diagnosed? This condition is diagnosed based on:  Your symptoms.  Your medical history.  A physical exam.  Tests to find out if your condition is short-term (acute) or long-term (chronic). Your doctor may: ? Check your nose for growths (polyps). ? Check your sinuses using a tool that has a light (endoscope). ? Check for allergies or germs. ? Do imaging tests, such as an MRI or CT scan. How is  this treated? Treatment for this condition depends on the cause and whether it is short-term or long-term.  If caused by a virus, your symptoms should go away on their own within 10 days. You may be given medicines to relieve symptoms. They include: ? Medicines that shrink swollen tissue in the nose. ? Medicines that treat allergies (antihistamines). ? A spray that treats swelling of the nostrils. ? Rinses that help get rid of thick mucus in your nose (nasal saline washes).  If caused by bacteria, your doctor may wait to see if you will get better without treatment. You may be given antibiotic medicine if you have: ? A very bad infection. ? A weak body defense system.  If caused by growths in the nose, you may need to have surgery. Follow these instructions at home: Medicines  Take, use, or apply over-the-counter and prescription medicines only as told by your doctor. These may include nasal sprays.  If you were prescribed an antibiotic medicine, take it as told by your doctor. Do not stop taking the antibiotic even if you start to feel better. Hydrate and humidify   Drink enough water to keep your pee (urine) pale yellow.  Use a cool mist humidifier to keep the humidity level in your home above 50%.  Breathe in steam for 10-15 minutes, 3-4 times a day, or as told by your doctor. You can do this in the bathroom while a hot shower is running.  Try not  to spend time in cool or dry air. Rest  Rest as much as you can.  Sleep with your head raised (elevated).  Make sure you get enough sleep each night. General instructions   Put a warm, moist washcloth on your face 3-4 times a day, or as often as told by your doctor. This will help with discomfort.  Wash your hands often with soap and water. If there is no soap and water, use hand sanitizer.  Do not smoke. Avoid being around people who are smoking (secondhand smoke).  Keep all follow-up visits as told by your doctor. This is  important. Contact a doctor if:  You have a fever.  Your symptoms get worse.  Your symptoms do not get better within 10 days. Get help right away if:  You have a very bad headache.  You cannot stop throwing up (vomiting).  You have very bad pain or swelling around your face or eyes.  You have trouble seeing.  You feel confused.  Your neck is stiff.  You have trouble breathing. Summary  Sinusitis is swelling of your sinuses. Sinuses are hollow spaces in the bones around your face.  This condition is caused by tissues in your nose that become inflamed or swollen. This traps germs. These can lead to infection.  If you were prescribed an antibiotic medicine, take it as told by your doctor. Do not stop taking it even if you start to feel better.  Keep all follow-up visits as told by your doctor. This is important. This information is not intended to replace advice given to you by your health care provider. Make sure you discuss any questions you have with your health care provider. Document Released: 09/12/2007 Document Revised: 08/26/2017 Document Reviewed: 08/26/2017 Elsevier Interactive Patient Education  2019 [employer].

## 2018-08-06 NOTE — Telephone Encounter (Signed)
Done. CPE scheduled for 12/03/2018 @9 :00 AM.

## 2018-10-02 ENCOUNTER — Other Ambulatory Visit: Payer: Self-pay | Admitting: Physician Assistant

## 2018-10-02 DIAGNOSIS — F410 Panic disorder [episodic paroxysmal anxiety] without agoraphobia: Secondary | ICD-10-CM

## 2018-10-02 MED ORDER — LORAZEPAM 1 MG PO TABS: ORAL_TABLET | ORAL | 2 refills | Status: DC

## 2018-10-02 NOTE — Telephone Encounter (Signed)
Pt needing refills on: LORazepam (ATIVAN) 1 MG tablet  Please fill at: [id number] - [place], [place] - [address] [phone] (Phone) [phone] (Fax)   Thanks, [employer]

## 2018-11-17 LAB — COMPREHENSIVE METABOLIC PANEL
Albumin: 4.3
Calcium: 9.6
Chloride: 98
EGFR (African American): 96
EGFR (Non-African Amer.): 83
GGT: 340
Globulin, Total: 3.1
Glucose: 99
LDH: 241
Phosphorus: 3.6
Total Protein: 7.4 g/dL
Uric Acid: 5

## 2018-11-17 LAB — CBC WITH DIFFERENTIAL/PLATELET
BASO%: 8 %
BASO(ABSOLUTE): 0.1
EOS (ABSOLUTE): 0.1
EOS%: 31 %
Immature Granulocytes: 0
LYMPH%: 295 %
Lymphs Abs: 2
MCH: 28.3 pg (ref 26–32)
MCHC: 31.5 g/dL — AB (ref 32–36.5)
MCV: 90 fL (ref 76.0–111.0)
Monocyes absolute: 0.6 10*3/uL (ref 0.1–1)
Monocyte %: 59
Neutrophil %: 59
RBC: 15.13 MIL/uL — AB (ref 3.8–5.1)
RDW: 13.1 % (ref 11.6–14)

## 2018-11-17 LAB — LIPID PANEL
Chol/HDL Ratio: 3.9
Cholesterol: 211 — AB (ref 0–200)
Estimated CHD Risk: 0.8
HDL: 54 (ref 35–70)
LDL Cholesterol: 125
Triglycerides: 162 — AB (ref 40–160)
VLDL Cholesterol Cal: 32

## 2018-11-17 LAB — TSH: TSH: 5.26 (ref 0.41–5.90)

## 2018-11-17 LAB — HEPATIC FUNCTION PANEL
AST: 31 (ref 13–35)
Alkaline Phosphatase: 130 — AB (ref 25–125)
Bilirubin, Total: 0.3

## 2018-11-17 LAB — THYROID PANEL: T4: 5.4

## 2018-11-17 LAB — BASIC METABOLIC PANEL
BUN: 14 (ref 4–21)
Creatinine: 0.8 (ref 0.5–1.1)
Glucose: 5
Potassium: 4.6 (ref 3.4–5.3)
Sodium: 137 (ref 137–147)

## 2018-11-17 LAB — CBC AND DIFFERENTIAL
HCT: 46 (ref 36–46)
Hemoglobin: 14.5 (ref 12.0–16.0)
Neutrophils Absolute: 4
Platelets: 295 (ref 150–399)
WBC: 6.6

## 2018-11-17 LAB — IRON,TIBC AND FERRITIN PANEL: Iron: 101
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
## 2018-12-03 ENCOUNTER — Encounter: Payer: Self-pay | Admitting: Physician Assistant

## 2018-12-03 ENCOUNTER — Other Ambulatory Visit: Payer: Self-pay

## 2018-12-03 ENCOUNTER — Ambulatory Visit (INDEPENDENT_AMBULATORY_CARE_PROVIDER_SITE_OTHER): Payer: BC Managed Care – PPO | Admitting: Physician Assistant

## 2018-12-03 DIAGNOSIS — Z1211 Encounter for screening for malignant neoplasm of colon: Secondary | ICD-10-CM | POA: Diagnosis not present

## 2018-12-03 DIAGNOSIS — Z0000 Encounter for general adult medical examination without abnormal findings: Secondary | ICD-10-CM | POA: Diagnosis not present

## 2018-12-03 DIAGNOSIS — Z23 Encounter for immunization: Secondary | ICD-10-CM | POA: Diagnosis not present

## 2018-12-03 DIAGNOSIS — G4700 Insomnia, unspecified: Secondary | ICD-10-CM

## 2018-12-03 DIAGNOSIS — F410 Panic disorder [episodic paroxysmal anxiety] without agoraphobia: Secondary | ICD-10-CM | POA: Diagnosis not present

## 2018-12-03 DIAGNOSIS — Z1239 Encounter for other screening for malignant neoplasm of breast: Secondary | ICD-10-CM | POA: Diagnosis not present

## 2018-12-03 DIAGNOSIS — J301 Allergic rhinitis due to pollen: Secondary | ICD-10-CM | POA: Diagnosis not present

## 2018-12-03 MED ORDER — LORAZEPAM 1 MG PO TABS: ORAL_TABLET | ORAL | 0 refills | Status: DC

## 2018-12-09 ENCOUNTER — Encounter: Payer: Self-pay | Admitting: *Deleted

## 2018-12-09 LAB — PULMONARY FUNCTION TEST

## 2018-12-12 IMAGING — DX DG WRIST COMPLETE 3+V*L*
4 series · 4 of 4 positions shown · non-contrast
Comparison: None.

CLINICAL DATA: Swelling to the left wrist after a fall today.
Previous surgery to the left wrist 27 years ago.

EXAM:
LEFT WRIST - COMPLETE 3+ VIEW

[wrist ap (1 of 2)]
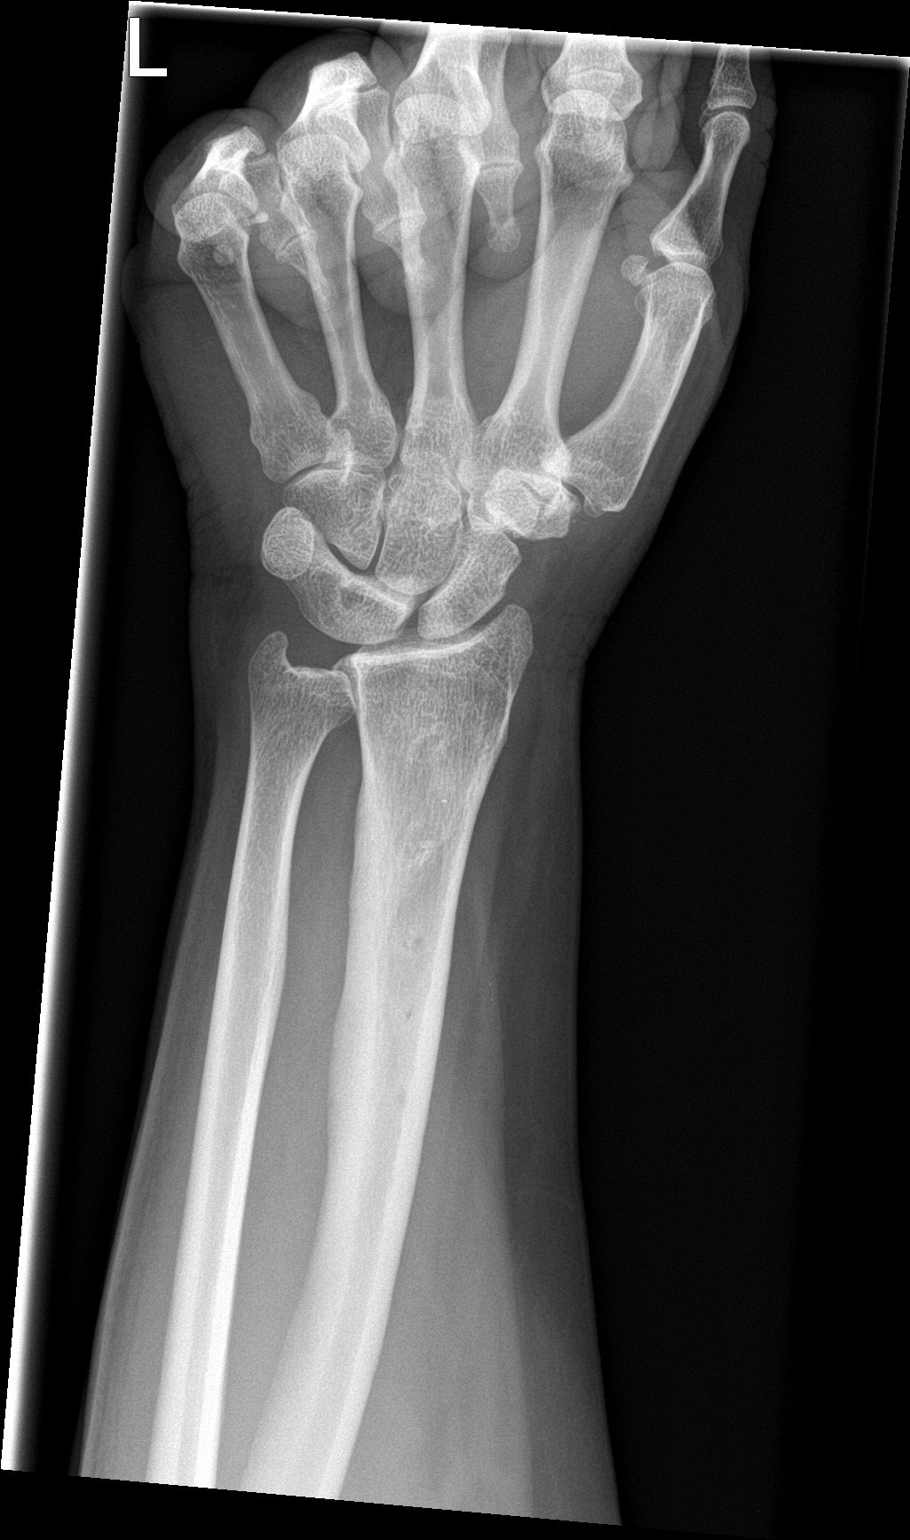

[wrist obl]
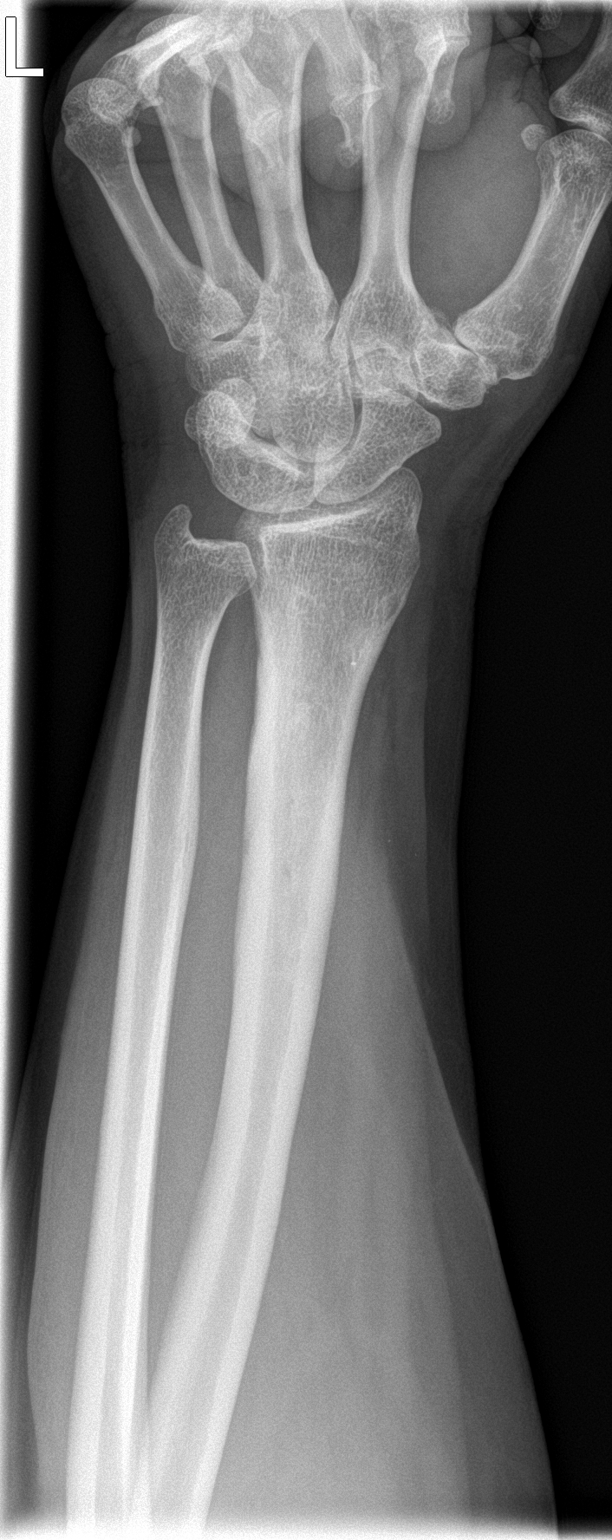

[wrist lat]
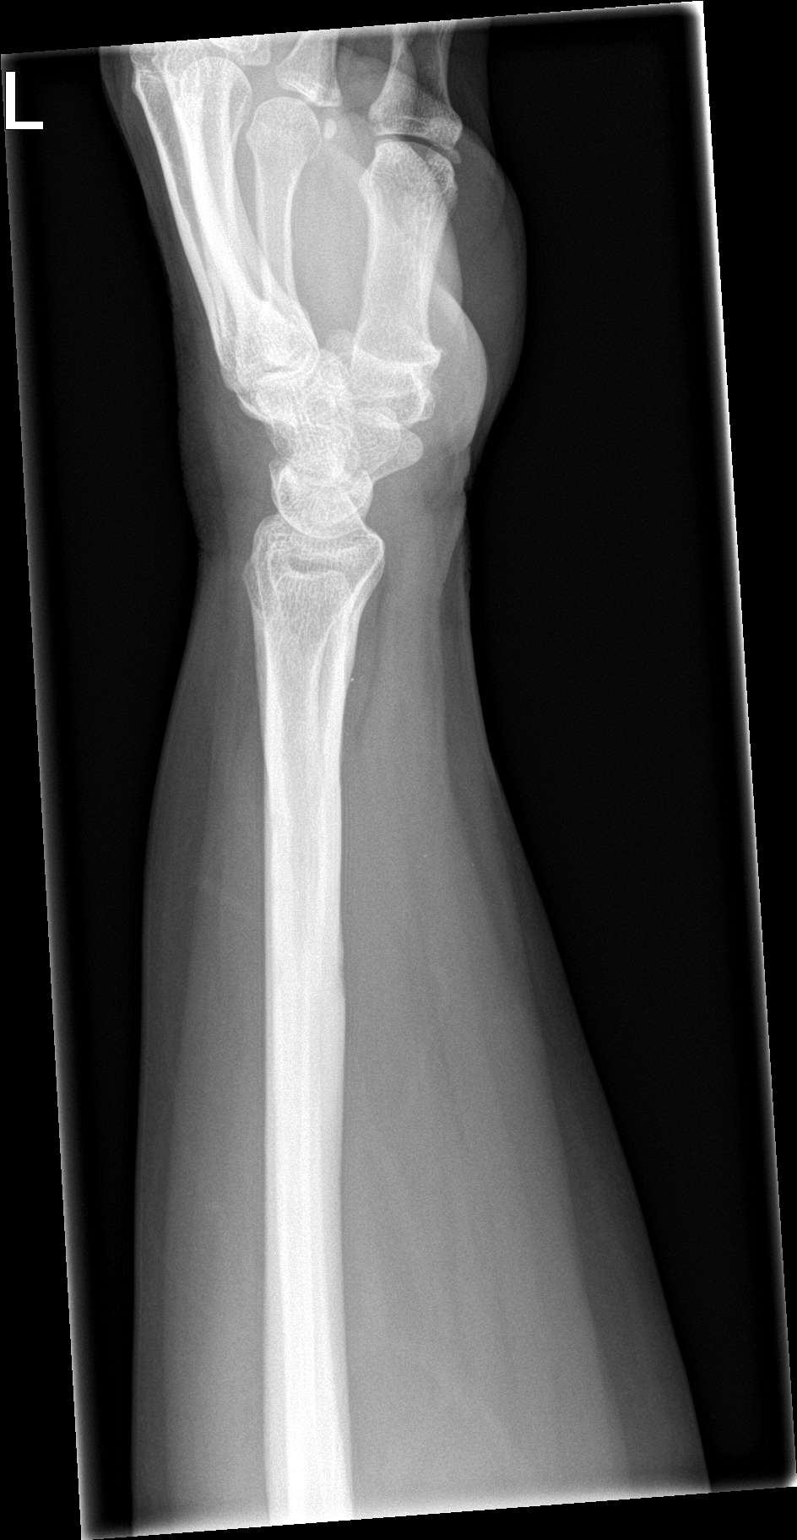

[wrist ap (2 of 2)]
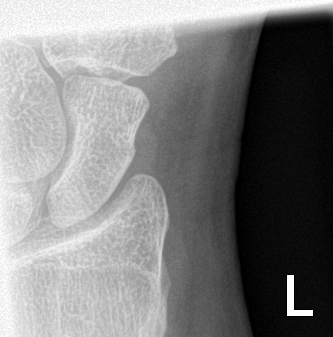

[4 of 4 positions shown; findings below may reference images not displayed]

FINDINGS: Postoperative changes with old pin tracks in the distal radius.
Cortical thickening and deformity of the distal radial shaft
consistent with healed fracture. Coalition of the lunate and
triquetrum with benign-appearing bone cyst in the lunate.
Degenerative changes in the radiocarpal and STT joints. No evidence
of acute fracture or dislocation. No focal bone lesion or bone
destruction. Bone cortex appears intact. Soft tissues are
unremarkable.
IMPRESSION: No acute bony abnormalities.

## 2018-12-19 ENCOUNTER — Telehealth: Payer: Self-pay | Admitting: Physician Assistant

## 2018-12-19 NOTE — Telephone Encounter (Signed)
Patient advised as below.  

## 2018-12-19 NOTE — Telephone Encounter (Signed)
Please let patient know her sleep study showed moderate sleep apnea. I have signed orders for a CPAP machine with either nasal or oral mask to help with claustrophobia. I think this is a large part of why she is not sleeping well and when we see her next in follow up we will proceed with tapering down Ativan at night.

## 2019-01-19 ENCOUNTER — Other Ambulatory Visit: Payer: Self-pay | Admitting: *Deleted

## 2019-01-19 DIAGNOSIS — F410 Panic disorder [episodic paroxysmal anxiety] without agoraphobia: Secondary | ICD-10-CM

## 2019-01-19 MED ORDER — LORAZEPAM 1 MG PO TABS: ORAL_TABLET | ORAL | 0 refills | Status: DC

## 2019-01-27 ENCOUNTER — Other Ambulatory Visit: Payer: Self-pay | Admitting: Family Medicine

## 2019-01-27 DIAGNOSIS — F419 Anxiety disorder, unspecified: Secondary | ICD-10-CM

## 2019-02-12 ENCOUNTER — Ambulatory Visit (INDEPENDENT_AMBULATORY_CARE_PROVIDER_SITE_OTHER): Payer: BC Managed Care – PPO | Admitting: Family Medicine

## 2019-02-12 ENCOUNTER — Encounter: Payer: Self-pay | Admitting: Family Medicine

## 2019-02-12 DIAGNOSIS — J0101 Acute recurrent maxillary sinusitis: Secondary | ICD-10-CM

## 2019-02-12 MED ORDER — DOXYCYCLINE HYCLATE 100 MG PO TABS: 100.0000 mg | ORAL_TABLET | Freq: Two times a day (BID) | ORAL | 0 refills | Status: DC

## 2019-02-12 NOTE — Progress Notes (Signed)
Virginia Martin  MRN: [id number] DOB: 11-Mar-1966  Subjective:  HPI   The patient is a 53 year old female who presents via electronic device for evaluation of congestion and sinus drainage.  Patient states she has had these symptoms for 2 months.   She has not had any Covid 19 testing during this time, she states "she has no need to".    Virtual Visit via Video Note  I connected with Virginia Martin on 02/12/19 at 10:20 AM EST by a video enabled telemedicine application and verified that I am speaking with the correct person using two identifiers.  Location: Patient: Home Provider: Office   I discussed the limitations of evaluation and management by telemedicine and the availability of in person appointments. The patient expressed understanding and agreed to proceed.   Patient Active Problem List   Diagnosis Date Noted  . Depression 02/16/2015  . Panic attacks 02/16/2015  . H/O: hypothyroidism 02/16/2015   Past Medical History:  Diagnosis Date  . Cancer ([place])   . Depression   . Thyroid disease    Past Surgical History:  Procedure Laterality Date  . ABDOMINAL HYSTERECTOMY  2002  . APPENDECTOMY  2002  . BREAST BIOPSY  2006  . INCISION AND DRAINAGE ABSCESS N/A 07/25/2015   Procedure: INCISION AND DRAINAGE ABSCESS / GLUTEAL;  Surgeon: Clayburn Pert, MD;  Location: [facility] ORS;  Service: General;  Laterality: N/A;  . WRIST SURGERY Left 1991-1993   Family History  Problem Relation Age of Onset  . Depression Mother   . Colon polyps Mother   . Heart attack Father   . Hypertension Father   . Diabetes Father   . Depression Sister   . Diabetes Maternal Grandmother   . Cancer Maternal Grandmother   . COPD Paternal Grandmother    Social History   Socioeconomic History  . Marital status: Married    Spouse name: Not on file  . Number of children: Not on file  . Years of education: Not on file  . Highest education level: Not on file  Occupational History  . Occupation: real  Conservation officer, historic buildings  Social Needs  . Financial resource strain: Not on file  . Food insecurity    Worry: Not on file    Inability: Not on file  . Transportation needs    Medical: Not on file    Non-medical: Not on file  Tobacco Use  . Smoking status: Never Smoker  . Smokeless tobacco: Never Used  Substance and Sexual Activity  . Alcohol use: No    Alcohol/week: 0.0 standard drinks  . Drug use: No  . Sexual activity: Never  Lifestyle  . Physical activity    Days per week: Not on file    Minutes per session: Not on file  . Stress: Not on file  Relationships  . Social Herbalist on phone: Not on file    Gets together: Not on file    Attends religious service: Not on file    Active member of club or organization: Not on file    Attends meetings of clubs or organizations: Not on file    Relationship status: Not on file  . Intimate partner violence    Fear of current or ex partner: Not on file    Emotionally abused: Not on file    Physically abused: Not on file    Forced sexual activity: Not on file  Other Topics Concern  . Not on file  Social History Narrative  . Not on file   Outpatient Encounter Medications as of 02/12/2019  Medication Sig Note  . ibuprofen (ADVIL,MOTRIN) 600 MG tablet Take 1 tablet (600 mg total) by mouth every 6 (six) hours as needed.   Marland Kitchen LORazepam (ATIVAN) 1 MG tablet TAKE 1 TABLET BY MOUTH AT BEDTIME AND 1/2 TAB EVERY 6 HOURS AS NEEDED FOR PANIC ATTACK   . meloxicam (MOBIC) 15 MG tablet  12/16/2015: Received from: External Pharmacy  . phentermine 30 MG capsule Take 30 mg by mouth every morning.   . venlafaxine XR (EFFEXOR-XR) 150 MG 24 hr capsule TAKE 1 CAPSULE BY MOUTH DAILY WITH BREAKFAST.    No facility-administered encounter medications on file as of 02/12/2019.     Allergies  Allergen Reactions  . Clindamycin Hcl Diarrhea    Other Reaction: GI trouble-hospitalized  . Codeine Rash  . Amoxicillin Itching    Other reaction(s): Other (See  Comments) Other Reaction: blisters  . Diphenhydramine Hcl Other (See Comments)    Other Reaction: racing heart  . Gabapentin Rash    Other Reaction: red skin   Review of Systems  Constitutional: Negative for chills, diaphoresis, fever and malaise/fatigue.  HENT: Positive for congestion, ear pain (pressure) and sinus pain. Negative for ear discharge, hearing loss, sore throat and tinnitus.        Drainage causes her to cough some and when she does she coughs out white thick mucous.  Respiratory: Negative for cough, sputum production, shortness of breath and wheezing.   Cardiovascular: Negative for chest pain.  Gastrointestinal: Negative for abdominal pain and diarrhea.  Musculoskeletal: Negative for myalgias.  Neurological: Negative for headaches.    Objective:  There were no vitals taken for this visit.  WDWN female in no apparent distress.  Head: Normocephalic, atraumatic. Neck: Supple, NROM Respiratory: No apparent distress Psych: Normal mood and affect   Assessment and Plan :  1. Acute recurrent maxillary sinusitis Developed nasal congestion, PND and pressure in the left maxillary sinus a couple months ago. Denies any sore throat, fever, cough, wheeze, shortness of breath, fatigue, body aches or loss of taste or smell. Was evaluated at a [facility] initially and treated with Singulair and OTC meds for allergic rhinitis. States this has persisted and had little relief with OTC medications. Has had similar episodes each Fall and Spring. Will treat with Doxycycline, Flonase, antihistamine with decongestant and increase in fluid intake. May use Singulair for suspected allergy component. Warned about COVID symptoms and need for testing if fever, cough, dyspnea, etc occurs. Counseled regarding COVID pandemic restrictions. Recheck prn. - doxycycline (VIBRA-TABS) 100 MG tablet; Take 1 tablet (100 mg total) by mouth 2 (two) times daily.  Dispense: 20 tablet; Refill: 0   I discussed the  assessment and treatment plan with the patient. The patient was provided an opportunity to ask questions and all were answered. The patient agreed with the plan and demonstrated an understanding of the instructions.   The patient was advised to call back or seek an in-person evaluation if the symptoms worsen or if the condition fails to improve as anticipated.  I provided 15 minutes of non-face-to-face time during this encounter.

## 2019-02-24 ENCOUNTER — Other Ambulatory Visit: Payer: Self-pay | Admitting: Physician Assistant

## 2019-02-24 DIAGNOSIS — F410 Panic disorder [episodic paroxysmal anxiety] without agoraphobia: Secondary | ICD-10-CM

## 2019-02-24 NOTE — Telephone Encounter (Signed)
Requested medication (s) are due for refill today: yes  Requested medication (s) are on the active medication list: yes  Last refill:  01/19/2019  Future visit scheduled: yes  Notes to clinic:  Refill cannot be delegated  Review for refill   Requested Prescriptions  Pending Prescriptions Disp Refills   LORazepam (ATIVAN) 1 MG tablet [Pharmacy Med Name: LORAZEPAM 1 MG TABLET] 35 tablet 0    Sig: TAKE 1 TABLET BY MOUTH AT BEDTIME AND 1/2 TAB EVERY 6 HOURS AS NEEDED FOR PANIC ATTACK     Not Delegated - Psychiatry:  Anxiolytics/Hypnotics Failed - 02/24/2019  8:49 AM      Failed - This refill cannot be delegated      Failed - Urine Drug Screen completed in last 360 days.      Passed - Valid encounter within last 6 months    Recent Outpatient Visits          1 week ago Acute recurrent maxillary sinusitis   [place], [place]   2 months ago Annual physical exam   [place], [place], PA-C   6 months ago Acute non-recurrent frontal sinusitis   [facility] [place], [place], [place]   1 year ago Acute left-sided low back pain without sciatica   [place], [place]   1 year ago Urinary frequency   [facility] [place], [place], [place]      Future Appointments            In 2 weeks Terrilee Croak, Wendee Beavers, [place], [facility]

## 2019-03-09 NOTE — Progress Notes (Signed)
Patient: Virginia Martin Female    DOB: [birth year]   53 y.o.   MRN: [id number] Visit Date: 03/10/2019  Today's Provider: Trinna Post, PA-C   Chief Complaint  Patient presents with  . Anxiety   Subjective:    Virtual Visit via Telephone Note  I connected with [name] on 03/10/19 at  8:40 AM EST by telephone and verified that I am speaking with the correct person using two identifiers.   I discussed the limitations, risks, security and privacy concerns of performing an evaluation and management service by telephone and the availability of in person appointments. I also discussed with the patient that there may be a patient responsible charge related to this service. The patient expressed understanding and agreed to proceed.  HPI    Anxiety Patient presents today for anxiety follow-up. Patient states that she is currently taking Ativan and Effexor-XR. Patient reports good compliance with treatment and no side effects at the moment. She is using 0.5 mg Ativan at night and 0.5 mg throughout the day. She is taking effexor 150 mg daily.   Last visit she was assessed for sleep apnea. Sleep study shows she has moderate sleep apnea. She was sent a CPAP machine with a nasal mask and reports difficulty using this due to panic attacks when using it. She reports she is a light sleeper and goes to the bathroom frequently at night, around every 2 hours.   Declines talking to counseling at this point due to busy schedule. Father in law has been diagnosed with terminal illness. She is dealing with stressors related to this, is POA for her in-laws.   GAD 7 : Generalized Anxiety Score 03/10/2019  Nervous, Anxious, on Edge 3  Control/stop worrying 3  Worry too much - different things 2  Trouble relaxing 2  Restless 2  Easily annoyed or irritable 3  Afraid - awful might happen 2  Total GAD 7 Score 17  Anxiety Difficulty Somewhat difficult     Allergies  Allergen Reactions  .  Clindamycin Hcl Diarrhea    Other Reaction: GI trouble-hospitalized  . Codeine Rash  . Amoxicillin Itching    Other reaction(s): Other (See Comments) Other Reaction: blisters  . Diphenhydramine Hcl Other (See Comments)    Other Reaction: racing heart  . Gabapentin Rash    Other Reaction: red skin     Current Outpatient Medications:  .  ibuprofen (ADVIL,MOTRIN) 600 MG tablet, Take 1 tablet (600 mg total) by mouth every 6 (six) hours as needed., Disp: 30 tablet, Rfl: 0 .  LORazepam (ATIVAN) 1 MG tablet, TAKE 1 TABLET BY MOUTH AT BEDTIME AND 1/2 TAB EVERY 6 HOURS AS NEEDED FOR PANIC ATTACK, Disp: 35 tablet, Rfl: 0 .  venlafaxine XR (EFFEXOR-XR) 150 MG 24 hr capsule, TAKE 1 CAPSULE BY MOUTH DAILY WITH BREAKFAST., Disp: 90 capsule, Rfl: 4 .  busPIRone (BUSPAR) 7.5 MG tablet, Take 1 tablet (7.5 mg total) by mouth 2 (two) times daily., Disp: 180 tablet, Rfl: 0 .  meloxicam (MOBIC) 15 MG tablet, , Disp: , Rfl:  .  phentermine 30 MG capsule, Take 30 mg by mouth every morning., Disp: , Rfl:   Review of Systems  Constitutional: Negative.   Respiratory: Negative.   Neurological: Negative.   Psychiatric/Behavioral: Negative.     Social History   Tobacco Use  . Smoking status: Never Smoker  . Smokeless tobacco: Never Used  Substance Use Topics  . Alcohol use: No  Alcohol/week: 0.0 standard drinks      Objective:   There were no vitals taken for this visit. There were no vitals filed for this visit.There is no height or weight on file to calculate BMI.   Physical Exam   No results found for any visits on 03/10/19.     Assessment & Plan    1. Anxiety  Patient has been diagnosed with moderate sleep apnea and I think a large majority of her sleep issues including light sleeping and nocturia stem from this. Patient reports still having panic attacks. She takes effexor 150 mg daily and Ativan 0.5 mg QHS and then 0.5 mg daily PRN. We will add buspar as below as anxiety is  suboptimally managed. We will decrease night time ativan dose to 0.25 mg QHS x 1 month, then 0.25 mg every other night x 1 month. Declines counseling at this point due to scheduling constraints. Follow up in 2 months.   - busPIRone (BUSPAR) 7.5 MG tablet; Take 1 tablet (7.5 mg total) by mouth 2 (two) times daily.  Dispense: 180 tablet; Refill: 0  2. Sleep apnea, unspecified type  Refer to pulmonology. I feel this creates a lot of her issues and it is not being addressed due to hesitance to wear mask.   The entirety of the information documented in the History of Present Illness, Review of Systems and Physical Exam were personally obtained by me. Portions of this information were initially documented by [facility], CMA and reviewed by me for thoroughness and accuracy.           Trinna Post, PA-C  [facility]

## 2019-03-10 ENCOUNTER — Telehealth (INDEPENDENT_AMBULATORY_CARE_PROVIDER_SITE_OTHER): Payer: BC Managed Care – PPO | Admitting: Physician Assistant

## 2019-03-10 DIAGNOSIS — G4730 Sleep apnea, unspecified: Secondary | ICD-10-CM | POA: Diagnosis not present

## 2019-03-10 DIAGNOSIS — F419 Anxiety disorder, unspecified: Secondary | ICD-10-CM

## 2019-03-10 MED ORDER — BUSPIRONE HCL 7.5 MG PO TABS: 7.5000 mg | ORAL_TABLET | Freq: Two times a day (BID) | ORAL | 0 refills | Status: AC

## 2019-03-10 NOTE — Patient Instructions (Signed)

## 2019-04-13 ENCOUNTER — Other Ambulatory Visit: Payer: Self-pay | Admitting: Physician Assistant

## 2019-04-13 DIAGNOSIS — F410 Panic disorder [episodic paroxysmal anxiety] without agoraphobia: Secondary | ICD-10-CM

## 2019-04-13 MED ORDER — LORAZEPAM 0.5 MG PO TABS: ORAL_TABLET | ORAL | 0 refills | Status: DC

## 2019-04-13 NOTE — Telephone Encounter (Signed)
Refilled ativan according to our discussed taper. Currently should be taking 0.5 mg daily PRN and then 0.25 mg up to every other night before bed.

## 2019-05-22 ENCOUNTER — Institutional Professional Consult (permissible substitution): Payer: Self-pay | Admitting: Internal Medicine

## 2019-06-27 ENCOUNTER — Other Ambulatory Visit: Payer: Self-pay | Admitting: Family Medicine

## 2019-06-27 DIAGNOSIS — F419 Anxiety disorder, unspecified: Secondary | ICD-10-CM

## 2020-01-29 ENCOUNTER — Other Ambulatory Visit: Payer: Self-pay | Admitting: Family Medicine

## 2020-01-29 DIAGNOSIS — F419 Anxiety disorder, unspecified: Secondary | ICD-10-CM

## 2020-01-29 NOTE — Telephone Encounter (Signed)
Requested medications are due for refill today yes  Requested medications are on the active medication list yes  Last refill 9/24  Last visit 03/2019  Future visit scheduled no  Notes to clinic Failed protocol of visit within 6 months, no visit scheduled.

## 2020-02-05 NOTE — Telephone Encounter (Signed)
Refilled for 2 months, she will be due for appt in 2 months.

## 2020-02-11 NOTE — Telephone Encounter (Signed)
Send patient a MyChart message advising her to call and schedule a follow-up appointment.

## 2020-02-15 NOTE — Telephone Encounter (Signed)
Patient called and scheduled a office visit for 02/26/20.

## 2020-02-26 ENCOUNTER — Ambulatory Visit: Payer: BC Managed Care – PPO | Admitting: Physician Assistant

## 2020-02-28 ENCOUNTER — Other Ambulatory Visit: Payer: Self-pay | Admitting: Physician Assistant

## 2020-02-28 DIAGNOSIS — F419 Anxiety disorder, unspecified: Secondary | ICD-10-CM

## 2020-02-28 NOTE — Telephone Encounter (Signed)
Requested medication (s) are due for refill today: No  Requested medication (s) are on the active medication list: Yes  Last refill:  02/01/20  Future visit scheduled: Yes  Notes to clinic:  Pharmacy requests 90 day supply and diagnosis code.    Requested Prescriptions  Pending Prescriptions Disp Refills   venlafaxine XR (EFFEXOR-XR) 150 MG 24 hr capsule [Pharmacy Med Name: VENLAFAXINE HCL ER 150 MG CAP] 90 capsule 1    Sig: TAKE 1 CAPSULE BY MOUTH DAILY WITH BREAKFAST.      Psychiatry: Antidepressants - SNRI - desvenlafaxine & venlafaxine Failed - 02/28/2020 12:30 PM      Failed - LDL in normal range and within 360 days    LDL Calculated  Date Value Ref Range Status  04/12/2017 113 (H) 0 - 99 mg/dL Final   LDL Cholesterol  Date Value Ref Range Status  11/17/2018 125  Final          Failed - Total Cholesterol in normal range and within 360 days    Cholesterol, Total  Date Value Ref Range Status  04/12/2017 202 (H) 100 - 199 mg/dL Final   Cholesterol  Date Value Ref Range Status  11/17/2018 211 (A) 0 - 200 Final          Failed - Triglycerides in normal range and within 360 days    Triglycerides  Date Value Ref Range Status  11/17/2018 162 (A) 40 - 160 Final          Failed - Completed PHQ-2 or PHQ-9 in the last 360 days      Failed - Last BP in normal range    BP Readings from Last 1 Encounters:  01/21/18 (!) 161/78          Failed - Valid encounter within last 6 months    Recent Outpatient Visits           11 months ago [place], Wendee Beavers, PA-C   1 year ago Acute recurrent maxillary sinusitis   [place], Vickki [place], [place]   1 year ago Annual physical exam   [place], [place], [place]   1 year ago Acute non-recurrent frontal sinusitis   [place], [place], [place]   2 years ago Acute left-sided low back pain without sciatica   [facility] [place], [place], [place]       Future Appointments             In 1 week Trinna Post, PA-C [employer], [facility]

## 2020-02-29 NOTE — Telephone Encounter (Signed)
Patient had a video visit on 03/10/2019 and a upcoming visit on 03/11/2020. Please advise.

## 2020-03-10 NOTE — Progress Notes (Signed)
Established patient visit   Patient: Virginia Martin   DOB: 15-May-1965   54 y.o. Female  MRN: [id number] Visit Date: 03/11/2020  Today's healthcare provider: Trinna Post, PA-C   Chief Complaint  Patient presents with  . Anxiety  I,Porsha C McClurkin,acting as a scribe for Trinna Post, PA-C.,have documented all relevant documentation on the behalf of Trinna Post, PA-C,as directed by  Trinna Post, PA-C while in the presence of Trinna Post, PA-C.  Subjective    HPI  Anxiety, Follow-up  She was last seen for anxiety 1 years ago. Changes made at last visit include added buspar (patient reports not taking)due to anxiety is suboptimally managed. We will decrease night time ativan dose to 0.25 mg QHS x 1 month, then 0.25 mg every other night x 1 month. Declines counseling at this point due to scheduling constraints.   She reports fair compliance with treatment. She reports good tolerance of treatment. She is not having side effects.   She feels her anxiety is mild and Unchanged since last visit.  Symptoms: No chest pain Yes difficulty concentrating  No dizziness No fatigue  No feelings of losing control No insomnia  Yes irritable No palpitations  No panic attacks Yes racing thoughts  No shortness of breath Yes sweating  No tremors/shakes    GAD-7 Results GAD-7 Generalized Anxiety Disorder Screening Tool 03/11/2020 03/10/2019  1. Feeling Nervous, Anxious, or on Edge 1 3  2. Not Being Able to Stop or Control Worrying 1 3  3. Worrying Too Much About Different Things 0 2  4. Trouble Relaxing 1 2  5. Being So Restless it's Hard To Sit Still 2 2  6. Becoming Easily Annoyed or Irritable 1 3  7. Feeling Afraid As If Something Awful Might Happen 0 2  Total GAD-7 Score 6 17  Difficulty At Work, Home, or Getting  Along With Others? Not difficult at all Somewhat difficult    PHQ-9 Scores PHQ9 SCORE ONLY 03/11/2020 12/03/2018  PHQ-9 Total Score 5 12   BP  Readings from Last 3 Encounters:  03/11/20 (!) 149/87  01/21/18 (!) 161/78  12/12/17 132/90    --------------------------------------------------------------------------------------------------- Sleep Apnea: has had sleep study 12/2018 which shows moderate sleep apnea. Patient unable to tolerate masks, even smaller iterations. Patient declines moving forward with alternative treatments for this.   Hot Flashes: Patient reports continuous hot flashes, especially at night. She has a history of hysterectomy and one remaining ovary.   Hypertension, follow-up  BP Readings from Last 3 Encounters:  03/11/20 (!) 149/87  01/21/18 (!) 161/78  12/12/17 132/90   Wt Readings from Last 3 Encounters:  03/11/20 211 lb 6.4 oz (95.9 kg)  01/21/18 200 lb (90.7 kg)  12/12/17 200 lb 6.4 oz (90.9 kg)      She does not smoke.  Use of agents associated with hypertension: decongestants. She has been using claritin D due to sinus drainage.   Outside blood pressures are not checked. Symptoms:  chest pain  chest pressure   palpitations  syncope   dyspnea  orthopnea   paroxysmal nocturnal dyspnea  lower extremity edema   Pertinent labs: Lab Results  Component Value Date   CHOL 211 (A) 11/17/2018   HDL 54 11/17/2018   LDLCALC 125 11/17/2018   TRIG 162 (A) 11/17/2018   CHOLHDL 3.9 11/17/2018   Lab Results  Component Value Date   NA 137 11/17/2018   K 4.6 11/17/2018   CREATININE 0.8  11/17/2018   GFRNONAA 83 11/17/2018   GFRAA 96 11/17/2018   GLUCOSE 104 (H) 04/12/2017     The 10-year ASCVD risk score Mikey Bussing DC Jr., et al., 2013) is: 2.4%* (Cholesterol units were assumed)   ---------------------------------------------------------------------------------------------------  She also reports consistent sinus drainage every morning. Not helped by claritin D.       Medications: Outpatient Medications Prior to Visit  Medication Sig  . ibuprofen (ADVIL,MOTRIN) 600 MG tablet Take 1 tablet (600  mg total) by mouth every 6 (six) hours as needed.  . [DISCONTINUED] venlafaxine XR (EFFEXOR-XR) 150 MG 24 hr capsule TAKE 1 CAPSULE BY MOUTH DAILY WITH BREAKFAST.  . [DISCONTINUED] LORazepam (ATIVAN) 0.5 MG tablet Take 0.5 mg daily PRN for anxiety. Then may take 0.25 mg nightly up to every other night for anxiety. (Patient not taking: Reported on 03/11/2020)  . [DISCONTINUED] meloxicam (MOBIC) 15 MG tablet  (Patient not taking: Reported on 03/11/2020)  . [DISCONTINUED] phentermine 30 MG capsule Take 30 mg by mouth every morning. (Patient not taking: Reported on 03/11/2020)   No facility-administered medications prior to visit.    Review of Systems  Constitutional: Negative.   Cardiovascular: Negative.   Psychiatric/Behavioral: Positive for decreased concentration. Negative for confusion, self-injury, sleep disturbance and suicidal ideas. The patient is nervous/anxious.       Objective    BP (!) 149/87 (BP Location: Right Arm, Patient Position: Sitting, Cuff Size: Large)   Pulse 71   Temp 98.4 F (36.9 C) (Oral)   Ht 5\' 4"  (1.626 m)   Wt 211 lb 6.4 oz (95.9 kg)   SpO2 97%   BMI 36.29 kg/m    Physical Exam Constitutional:      Appearance: Normal appearance. She is obese.  HENT:     Right Ear: Tympanic membrane normal.     Left Ear: Tympanic membrane normal.     Mouth/Throat:     Mouth: Mucous membranes are moist.     Tongue: No lesions.     Pharynx: Oropharynx is clear.  Cardiovascular:     Rate and Rhythm: Normal rate and regular rhythm.     Heart sounds: Normal heart sounds.  Pulmonary:     Effort: Pulmonary effort is normal.     Breath sounds: Normal breath sounds.  Skin:    General: Skin is warm and dry.  Neurological:     General: No focal deficit present.     Mental Status: She is alert and oriented to person, place, and time.  Psychiatric:        Mood and Affect: Mood normal.        Behavior: Behavior normal.       No results found for any visits on  03/11/20.  Assessment & Plan    1. Depression, unspecified depression type  Continue effexor.  2. Panic attacks  Patient reports persistent panic attacks. She has been tapered off Ativan. Will start hydroxyzine as below.   - hydrOXYzine (ATARAX/VISTARIL) 10 MG tablet; Take 1 tablet (10 mg total) by mouth 3 (three) times daily as needed.  Dispense: 30 tablet; Refill: 0  3. Encounter for screening mammogram for malignant neoplasm of breast   - MM Digital Screening; Future  4. Colon cancer screening  - Cologuard  5. Primary hypertension  Wants to take 6 months and work on lifestyle. If BP still elevated, would recommend medication. Advised to stop using decongestant. Follow up 6 months CPE and BP.   - TSH - Lipid panel - Comprehensive metabolic  panel - CBC with Differential/Platelet  6. Acute anxiety  - venlafaxine XR (EFFEXOR-XR) 150 MG 24 hr capsule; Take 1 capsule (150 mg total) by mouth daily with breakfast.  Dispense: 90 capsule; Refill: 1  7. PND (post-nasal drip)  Add singulair to antihistamine, try xyzal. Please avoid decongestants.   - montelukast (SINGULAIR) 10 MG tablet; Take 1 tablet (10 mg total) by mouth at bedtime.  Dispense: 90 tablet; Refill: 1  8. Encounter for screening for HIV  - HIV Antibody (routine testing w rflx)  9. Encounter for hepatitis C screening test for low risk patient  - Hepatitis C Antibody  10. Obesity with serious comorbidity, unspecified classification, unspecified obesity type   11. Obstructive Sleep Apnea   Patient reports not being able to tolerate mask. Not interested in moving forward with other options like Bipap or ENT referral.   12. Hot Flashes  Likely due to menopause. Counseled on tx including HRT, paxil, clonidine. BP would have to be controlled to proceed with HRT.    No follow-ups on file.      I, Porsha C McClurkin, CMA, have reviewed all documentation for this visit. The documentation on 03/11/20 for  the exam, diagnosis, procedures, and orders are all accurate and complete.  The entirety of the information documented in the History of Present Illness, Review of Systems and Physical Exam were personally obtained by me. Portions of this information were initially documented by [facility] and reviewed by me for thoroughness and accuracy.     Paulene Floor  [facility] [phone] (phone) [phone] (fax)  [place]

## 2020-03-11 ENCOUNTER — Ambulatory Visit: Payer: BC Managed Care – PPO | Admitting: Physician Assistant

## 2020-03-11 ENCOUNTER — Other Ambulatory Visit: Payer: Self-pay

## 2020-03-11 ENCOUNTER — Encounter: Payer: Self-pay | Admitting: Physician Assistant

## 2020-03-11 VITALS — BP 149/87 | HR 71 | Temp 98.4°F | Ht 64.0 in | Wt 211.4 lb

## 2020-03-11 DIAGNOSIS — F32A Depression, unspecified: Secondary | ICD-10-CM | POA: Diagnosis not present

## 2020-03-11 DIAGNOSIS — G4733 Obstructive sleep apnea (adult) (pediatric): Secondary | ICD-10-CM

## 2020-03-11 DIAGNOSIS — Z1211 Encounter for screening for malignant neoplasm of colon: Secondary | ICD-10-CM | POA: Diagnosis not present

## 2020-03-11 DIAGNOSIS — Z1231 Encounter for screening mammogram for malignant neoplasm of breast: Secondary | ICD-10-CM | POA: Diagnosis not present

## 2020-03-11 DIAGNOSIS — R232 Flushing: Secondary | ICD-10-CM

## 2020-03-11 DIAGNOSIS — Z114 Encounter for screening for human immunodeficiency virus [HIV]: Secondary | ICD-10-CM

## 2020-03-11 DIAGNOSIS — R0982 Postnasal drip: Secondary | ICD-10-CM

## 2020-03-11 DIAGNOSIS — E669 Obesity, unspecified: Secondary | ICD-10-CM

## 2020-03-11 DIAGNOSIS — F410 Panic disorder [episodic paroxysmal anxiety] without agoraphobia: Secondary | ICD-10-CM

## 2020-03-11 DIAGNOSIS — Z1159 Encounter for screening for other viral diseases: Secondary | ICD-10-CM

## 2020-03-11 DIAGNOSIS — I10 Essential (primary) hypertension: Secondary | ICD-10-CM

## 2020-03-11 DIAGNOSIS — F419 Anxiety disorder, unspecified: Secondary | ICD-10-CM

## 2020-03-11 MED ORDER — MONTELUKAST SODIUM 10 MG PO TABS: 10.0000 mg | ORAL_TABLET | Freq: Every day | ORAL | 1 refills | Status: DC

## 2020-03-11 MED ORDER — VENLAFAXINE HCL ER 150 MG PO CP24: 150.0000 mg | ORAL_CAPSULE | Freq: Every day | ORAL | 1 refills | Status: DC

## 2020-03-11 MED ORDER — HYDROXYZINE HCL 10 MG PO TABS: 10.0000 mg | ORAL_TABLET | Freq: Three times a day (TID) | ORAL | 0 refills | Status: DC | PRN

## 2020-03-11 NOTE — Patient Instructions (Addendum)
Please use xyzal nightly. Please do not take decongestant     Hypertension, Adult Hypertension is another name for high blood pressure. High blood pressure forces your heart to work harder to pump blood. This can cause problems over time. There are two numbers in a blood pressure reading. There is a top number (systolic) over a bottom number (diastolic). It is best to have a blood pressure that is below 120/80. Healthy choices can help lower your blood pressure, or you may need medicine to help lower it. What are the causes? The cause of this condition is not known. Some conditions may be related to high blood pressure. What increases the risk?  Smoking.  Having type 2 diabetes mellitus, high cholesterol, or both.  Not getting enough exercise or physical activity.  Being overweight.  Having too much fat, sugar, calories, or salt (sodium) in your diet.  Drinking too much alcohol.  Having long-term (chronic) kidney disease.  Having a family history of high blood pressure.  Age. Risk increases with age.  Race. You may be at higher risk if you are African American.  Gender. Men are at higher risk than women before age 33. After age 14, women are at higher risk than men.  Having obstructive sleep apnea.  Stress. What are the signs or symptoms?  High blood pressure may not cause symptoms. Very high blood pressure (hypertensive crisis) may cause: ? Headache. ? Feelings of worry or nervousness (anxiety). ? Shortness of breath. ? Nosebleed. ? A feeling of being sick to your stomach (nausea). ? Throwing up (vomiting). ? Changes in how you see. ? Very bad chest pain. ? Seizures. How is this treated?  This condition is treated by making healthy lifestyle changes, such as: ? Eating healthy foods. ? Exercising more. ? Drinking less alcohol.  Your health care provider may prescribe medicine if lifestyle changes are not enough to get your blood pressure under control, and  if: ? Your top number is above 130. ? Your bottom number is above 80.  Your personal target blood pressure may vary. Follow these instructions at home: Eating and drinking   If told, follow the DASH eating plan. To follow this plan: ? Fill one half of your plate at each meal with fruits and vegetables. ? Fill one fourth of your plate at each meal with whole grains. Whole grains include whole-wheat pasta, brown rice, and whole-grain bread. ? Eat or drink low-fat dairy products, such as skim milk or low-fat yogurt. ? Fill one fourth of your plate at each meal with low-fat (lean) proteins. Low-fat proteins include fish, chicken without skin, eggs, beans, and tofu. ? Avoid fatty meat, cured and processed meat, or chicken with skin. ? Avoid pre-made or processed food.  Eat less than 1,500 mg of salt each day.  Do not drink alcohol if: ? Your doctor tells you not to drink. ? You are pregnant, may be pregnant, or are planning to become pregnant.  If you drink alcohol: ? Limit how much you use to:  0-1 drink a day for women.  0-2 drinks a day for men. ? Be aware of how much alcohol is in your drink. In the [place] one drink equals one 12 oz bottle of beer (355 mL), one 5 oz glass of wine (148 mL), or one 1 oz glass of hard liquor (44 mL). Lifestyle   Work with your doctor to stay at a healthy weight or to lose weight. Ask your doctor what the best  weight is for you.  Get at least 30 minutes of exercise most days of the week. This may include walking, swimming, or biking.  Get at least 30 minutes of exercise that strengthens your muscles (resistance exercise) at least 3 days a week. This may include lifting weights or doing Pilates.  Do not use any products that contain nicotine or tobacco, such as cigarettes, e-cigarettes, and chewing tobacco. If you need help quitting, ask your doctor.  Check your blood pressure at home as told by your doctor.  Keep all follow-up visits as told by  your doctor. This is important. Medicines  Take over-the-counter and prescription medicines only as told by your doctor. Follow directions carefully.  Do not skip doses of blood pressure medicine. The medicine does not work as well if you skip doses. Skipping doses also puts you at risk for problems.  Ask your doctor about side effects or reactions to medicines that you should watch for. Contact a doctor if you:  Think you are having a reaction to the medicine you are taking.  Have headaches that keep coming back (recurring).  Feel dizzy.  Have swelling in your ankles.  Have trouble with your vision. Get help right away if you:  Get a very bad headache.  Start to feel mixed up (confused).  Feel weak or numb.  Feel faint.  Have very bad pain in your: ? Chest. ? Belly (abdomen).  Throw up more than once.  Have trouble breathing. Summary  Hypertension is another name for high blood pressure.  High blood pressure forces your heart to work harder to pump blood.  For most people, a normal blood pressure is less than 120/80.  Making healthy choices can help lower blood pressure. If your blood pressure does not get lower with healthy choices, you may need to take medicine. This information is not intended to replace advice given to you by your health care provider. Make sure you discuss any questions you have with your health care provider. Document Revised: 12/04/2017 Document Reviewed: 12/04/2017 Elsevier Patient Education  2020 [employer].

## 2020-03-12 LAB — CBC WITH DIFFERENTIAL/PLATELET
Basophils Absolute: 0.1 10*3/uL (ref 0.0–0.2)
Basos: 1 %
EOS (ABSOLUTE): 0.1 10*3/uL (ref 0.0–0.4)
Eos: 1 %
Hematocrit: 42.9 % (ref 34.0–46.6)
Hemoglobin: 14.4 g/dL (ref 11.1–15.9)
Immature Grans (Abs): 0 10*3/uL (ref 0.0–0.1)
Immature Granulocytes: 0 %
Lymphocytes Absolute: 1.5 10*3/uL (ref 0.7–3.1)
Lymphs: 26 %
MCH: 29.4 pg (ref 26.6–33.0)
MCHC: 33.6 g/dL (ref 31.5–35.7)
MCV: 88 fL (ref 79–97)
Monocytes Absolute: 0.5 10*3/uL (ref 0.1–0.9)
Monocytes: 8 %
Neutrophils Absolute: 3.7 10*3/uL (ref 1.4–7.0)
Neutrophils: 64 %
Platelets: 267 10*3/uL (ref 150–450)
RBC: 4.89 x10E6/uL (ref 3.77–5.28)
RDW: 12.4 % (ref 11.7–15.4)
WBC: 5.9 10*3/uL (ref 3.4–10.8)

## 2020-03-12 LAB — COMPREHENSIVE METABOLIC PANEL
ALT: 43 IU/L — ABNORMAL HIGH (ref 0–32)
AST: 27 IU/L (ref 0–40)
Albumin/Globulin Ratio: 1.5 (ref 1.2–2.2)
Albumin: 4.6 g/dL (ref 3.8–4.9)
Alkaline Phosphatase: 119 IU/L (ref 44–121)
BUN/Creatinine Ratio: 19 (ref 9–23)
BUN: 16 mg/dL (ref 6–24)
Bilirubin Total: 0.3 mg/dL (ref 0.0–1.2)
CO2: 25 mmol/L (ref 20–29)
Calcium: 9.6 mg/dL (ref 8.7–10.2)
Chloride: 101 mmol/L (ref 96–106)
Creatinine, Ser: 0.86 mg/dL (ref 0.57–1.00)
GFR calc Af Amer: 89 mL/min/{1.73_m2} (ref >59)
GFR calc non Af Amer: 77 mL/min/{1.73_m2} (ref >59)
Globulin, Total: 3.1 g/dL (ref 1.5–4.5)
Glucose: 131 mg/dL — ABNORMAL HIGH (ref 65–99)
Potassium: 4.4 mmol/L (ref 3.5–5.2)
Sodium: 141 mmol/L (ref 134–144)
Total Protein: 7.7 g/dL (ref 6.0–8.5)

## 2020-03-12 LAB — LIPID PANEL
Chol/HDL Ratio: 4.8 ratio — ABNORMAL HIGH (ref 0.0–4.4)
Cholesterol, Total: 243 mg/dL — ABNORMAL HIGH (ref 100–199)
HDL: 51 mg/dL (ref >39)
LDL Chol Calc (NIH): 154 mg/dL — ABNORMAL HIGH (ref 0–99)
Triglycerides: 206 mg/dL — ABNORMAL HIGH (ref 0–149)
VLDL Cholesterol Cal: 38 mg/dL (ref 5–40)

## 2020-03-12 LAB — HIV ANTIBODY (ROUTINE TESTING W REFLEX): HIV Screen 4th Generation wRfx: NONREACTIVE

## 2020-03-12 LAB — TSH: TSH: 8.17 u[IU]/mL — ABNORMAL HIGH (ref 0.450–4.500)

## 2020-03-12 LAB — HEPATITIS C ANTIBODY: Hep C Virus Ab: <0.1 s/co ratio (ref 0.0–0.9)

## 2020-03-19 ENCOUNTER — Encounter: Payer: Self-pay | Admitting: Physician Assistant

## 2020-03-19 DIAGNOSIS — E039 Hypothyroidism, unspecified: Secondary | ICD-10-CM

## 2020-03-21 MED ORDER — LEVOTHYROXINE SODIUM 50 MCG PO TABS: 50.0000 ug | ORAL_TABLET | Freq: Every day | ORAL | 0 refills | Status: DC

## 2020-03-21 NOTE — Telephone Encounter (Signed)
Can we call and schedule thyroid follow up 2 months? Thank you.

## 2020-03-22 NOTE — Telephone Encounter (Signed)
Called patient and no answer. Send a MyChart message to patient to call and schedule a 2 month f/u.

## 2020-03-23 LAB — EXTERNAL GENERIC LAB PROCEDURE: COLOGUARD: NEGATIVE

## 2020-03-23 LAB — COLOGUARD
COLOGUARD: NEGATIVE
Cologuard: NEGATIVE

## 2020-05-04 NOTE — Progress Notes (Signed)
Established patient visit   Patient: Virginia Martin   DOB: 01-Aug-1965   55 y.o. Female  MRN: [id number] Visit Date: 05/05/2020  Today's healthcare provider: Trinna Post, PA-C   Chief Complaint  Patient presents with  . Hypertension  . Hypothyroidism   Subjective    HPI  Hypothyroid, follow-up  Lab Results  Component Value Date   TSH 8.170 (H) 03/11/2020   TSH 5.26 11/17/2018   TSH 3.640 04/12/2017   FREET4 0.84 04/12/2017   Wt Readings from Last 3 Encounters:  05/05/20 210 lb 12.8 oz (95.6 kg)  03/11/20 211 lb 6.4 oz (95.9 kg)  01/21/18 200 lb (90.7 kg)    She was last seen for hypothyroid 1 months ago.  Management since that visit includes starting Levothyroxine 50 mcg daily. She reports good compliance with treatment. She is not having side effects.   Symptoms: No change in energy level No constipation  No diarrhea No heat / cold intolerance  No nervousness No palpitations  No weight changes    -----------------------------------------------------------------------------------------   Hypertension, follow-up  BP Readings from Last 3 Encounters:  05/05/20 (!) 157/106  03/11/20 (!) 149/87  01/21/18 (!) 161/78   Wt Readings from Last 3 Encounters:  05/05/20 210 lb 12.8 oz (95.6 kg)  03/11/20 211 lb 6.4 oz (95.9 kg)  01/21/18 200 lb (90.7 kg)     She was last seen for hypertension 6 weeks ago.  BP at that visit was 149/ 87. Management since that visit includes counseling patient to work on life style changes. She has since discontinued use of decongestants.   She reports good compliance with treatment. She is not having side effects.  She is following a Regular diet. She is not exercising. She does not smoke.  Use of agents associated with hypertension: thyroid hormones.   Outside blood pressures are 145/ 90. Symptoms: No chest pain No chest pressure  No palpitations No syncope  No dyspnea No orthopnea  No paroxysmal nocturnal dyspnea  No lower extremity edema   Pertinent labs: Lab Results  Component Value Date   CHOL 243 (H) 03/11/2020   HDL 51 03/11/2020   LDLCALC 154 (H) 03/11/2020   TRIG 206 (H) 03/11/2020   CHOLHDL 4.8 (H) 03/11/2020   Lab Results  Component Value Date   NA 141 03/11/2020   K 4.4 03/11/2020   CREATININE 0.86 03/11/2020   GFRNONAA 77 03/11/2020   GFRAA 89 03/11/2020   GLUCOSE 131 (H) 03/11/2020     The 10-year ASCVD risk score Mikey Bussing DC Jr., et al., 2013) is: 3.3%   ---------------------------------------------------------------------------------------------------     Medications: Outpatient Medications Prior to Visit  Medication Sig  . ibuprofen (ADVIL,MOTRIN) 600 MG tablet Take 1 tablet (600 mg total) by mouth every 6 (six) hours as needed.  Marland Kitchen levothyroxine (SYNTHROID) 50 MCG tablet Take 1 tablet (50 mcg total) by mouth daily.  Marland Kitchen venlafaxine XR (EFFEXOR-XR) 150 MG 24 hr capsule Take 1 capsule (150 mg total) by mouth daily with breakfast.  . hydrOXYzine (ATARAX/VISTARIL) 10 MG tablet Take 1 tablet (10 mg total) by mouth 3 (three) times daily as needed. (Patient not taking: Reported on 05/05/2020)  . [DISCONTINUED] montelukast (SINGULAIR) 10 MG tablet Take 1 tablet (10 mg total) by mouth at bedtime. (Patient not taking: Reported on 05/05/2020)   No facility-administered medications prior to visit.    Review of Systems  Constitutional: Negative for appetite change, chills, fatigue and fever.  Respiratory: Negative for chest  tightness and shortness of breath.   Cardiovascular: Negative for chest pain and palpitations.  Gastrointestinal: Negative for abdominal pain, nausea and vomiting.  Neurological: Negative for dizziness and weakness.       Objective    BP (!) 157/106   Pulse 69   Temp 98.1 F (36.7 C) (Temporal)   Wt 210 lb 12.8 oz (95.6 kg)   BMI 36.18 kg/m     Physical Exam Constitutional:      Appearance: Normal appearance.  Cardiovascular:     Rate and Rhythm:  Normal rate and regular rhythm.     Heart sounds: Normal heart sounds.  Pulmonary:     Effort: Pulmonary effort is normal. No respiratory distress.     Breath sounds: Normal breath sounds.  Skin:    General: Skin is warm and dry.  Neurological:     Mental Status: She is alert and oriented to person, place, and time. Mental status is at baseline.  Psychiatric:        Mood and Affect: Mood normal.        Behavior: Behavior normal.       No results found for any visits on 05/05/20.  Assessment & Plan    1. Hypothyroidism, unspecified type  Recheck TSH after addition of levothyroxine 50 mcg daily. Titrate as necessar.   - TSH  2. Primary hypertension  Start amlodipine and titrate as below. We will see her back 09/2020.   - amLODipine (NORVASC) 10 MG tablet; Take 1/2 tablet daily for two weeks. If blood pressure >140/90, please increase to 1 tablet daily.  Dispense: 90 tablet; Refill: 1        I, Trinna Post, PA-C, have reviewed all documentation for this visit. The documentation on 05/05/20 for the exam, diagnosis, procedures, and orders are all accurate and complete.  The entirety of the information documented in the History of Present Illness, Review of Systems and Physical Exam were personally obtained by me. Portions of this information were initially documented by Meyer Cory, CMA and reviewed by me for thoroughness and accuracy.      Paulene Floor  [facility] [phone] (phone) [phone] (fax)  [place]

## 2020-05-05 ENCOUNTER — Encounter: Payer: Self-pay | Admitting: Physician Assistant

## 2020-05-05 ENCOUNTER — Ambulatory Visit: Payer: BC Managed Care – PPO | Admitting: Physician Assistant

## 2020-05-05 ENCOUNTER — Other Ambulatory Visit: Payer: Self-pay

## 2020-05-05 VITALS — BP 157/106 | HR 69 | Temp 98.1°F | Wt 210.8 lb

## 2020-05-05 DIAGNOSIS — E039 Hypothyroidism, unspecified: Secondary | ICD-10-CM | POA: Diagnosis not present

## 2020-05-05 DIAGNOSIS — I152 Hypertension secondary to endocrine disorders: Secondary | ICD-10-CM | POA: Insufficient documentation

## 2020-05-05 DIAGNOSIS — E1159 Type 2 diabetes mellitus with other circulatory complications: Secondary | ICD-10-CM | POA: Insufficient documentation

## 2020-05-05 DIAGNOSIS — I10 Essential (primary) hypertension: Secondary | ICD-10-CM | POA: Diagnosis not present

## 2020-05-05 MED ORDER — AMLODIPINE BESYLATE 10 MG PO TABS: ORAL_TABLET | ORAL | 1 refills | Status: DC

## 2020-05-05 NOTE — Patient Instructions (Signed)

## 2020-05-06 LAB — TSH: TSH: 4.43 u[IU]/mL (ref 0.450–4.500)

## 2020-06-10 ENCOUNTER — Other Ambulatory Visit: Payer: Self-pay | Admitting: Physician Assistant

## 2020-06-10 DIAGNOSIS — E039 Hypothyroidism, unspecified: Secondary | ICD-10-CM

## 2020-07-15 ENCOUNTER — Ambulatory Visit: Payer: Self-pay | Admitting: Family Medicine

## 2020-09-09 ENCOUNTER — Encounter: Payer: Self-pay | Admitting: Physician Assistant

## 2020-09-19 ENCOUNTER — Telehealth: Payer: Self-pay

## 2020-09-19 DIAGNOSIS — F419 Anxiety disorder, unspecified: Secondary | ICD-10-CM

## 2020-09-19 MED ORDER — VENLAFAXINE HCL ER 150 MG PO CP24: 150.0000 mg | ORAL_CAPSULE | Freq: Every day | ORAL | 0 refills | Status: DC

## 2020-09-19 NOTE — Telephone Encounter (Signed)
[facility] faxed refill request for the following medications:  venlafaxine XR (EFFEXOR-XR) 150 MG 24 hr capsule   Please advise.

## 2020-09-19 NOTE — Telephone Encounter (Signed)
Last office visit was 03/11/20 patient is due for follow up visit, will refill only for 30 days. KW

## 2020-10-16 ENCOUNTER — Other Ambulatory Visit: Payer: Self-pay | Admitting: Family Medicine

## 2020-10-16 DIAGNOSIS — F419 Anxiety disorder, unspecified: Secondary | ICD-10-CM

## 2020-10-16 NOTE — Telephone Encounter (Signed)
Pt overdue for OV. Was previously given courtesy refill on 09/19/20 # 30. Sent pt message via MyChart to make an appointment. Requested Prescriptions  Pending Prescriptions Disp Refills   venlafaxine XR (EFFEXOR-XR) 150 MG 24 hr capsule [Pharmacy Med Name: VENLAFAXINE HCL ER 150 MG CAP] 30 capsule 0    Sig: TAKE 1 CAPSULE BY MOUTH DAILY WITH BREAKFAST PLEASE SCHEDULE AN OFFICE VISIT BEFORE ANYMORE REFILLS      Psychiatry: Antidepressants - SNRI - desvenlafaxine & venlafaxine Failed - 10/16/2020 12:46 AM      Failed - LDL in normal range and within 360 days    LDL Chol Calc (NIH)  Date Value Ref Range Status  03/11/2020 154 (H) 0 - 99 mg/dL Final          Failed - Total Cholesterol in normal range and within 360 days    Cholesterol, Total  Date Value Ref Range Status  03/11/2020 243 (H) 100 - 199 mg/dL Final          Failed - Triglycerides in normal range and within 360 days    Triglycerides  Date Value Ref Range Status  03/11/2020 206 (H) 0 - 149 mg/dL Final          Failed - Last BP in normal range    BP Readings from Last 1 Encounters:  05/05/20 (!) 157/106          Passed - Completed PHQ-2 or PHQ-9 in the last 360 days      Passed - Valid encounter within last 6 months    Recent Outpatient Visits           5 months ago Hypothyroidism, unspecified type   [place], [place], PA-C   7 months ago Depression, unspecified depression type   [facility] [place], Wendee Beavers, [place]   1 year ago [place], [place], [place]   1 year ago Acute recurrent maxillary sinusitis   [facility] Chrismon, Vickki Muff, PA-C   1 year ago Annual physical exam   [facility] Carles Collet M, [place]

## 2020-12-01 ENCOUNTER — Other Ambulatory Visit: Payer: Self-pay | Admitting: Family Medicine

## 2020-12-01 DIAGNOSIS — F419 Anxiety disorder, unspecified: Secondary | ICD-10-CM

## 2020-12-01 NOTE — Telephone Encounter (Signed)
   Notes to clinic:  REQUEST FOR 90 DAYS PRESCRIPTION. DX Code Needed   Requested Prescriptions  Pending Prescriptions Disp Refills   venlafaxine XR (EFFEXOR-XR) 150 MG 24 hr capsule [Pharmacy Med Name: VENLAFAXINE HCL ER 150 MG CAP] 90 capsule 1    Sig: TAKE 1 CAPSULE BY MOUTH DAILY WITH BREAKFAST PLEASE SCHEDULE AN OFFICE VISIT BEFORE ANYMORE REFILLS     Psychiatry: Antidepressants - SNRI - desvenlafaxine & venlafaxine Failed - 12/01/2020  9:36 AM      Failed - LDL in normal range and within 360 days    LDL Chol Calc (NIH)  Date Value Ref Range Status  03/11/2020 154 (H) 0 - 99 mg/dL Final          Failed - Total Cholesterol in normal range and within 360 days    Cholesterol, Total  Date Value Ref Range Status  03/11/2020 243 (H) 100 - 199 mg/dL Final          Failed - Triglycerides in normal range and within 360 days    Triglycerides  Date Value Ref Range Status  03/11/2020 206 (H) 0 - 149 mg/dL Final          Failed - Last BP in normal range    BP Readings from Last 1 Encounters:  05/05/20 (!) 157/106          Failed - Valid encounter within last 6 months    Recent Outpatient Visits           7 months ago Hypothyroidism, unspecified type   [place], [place], PA-C   8 months ago Depression, unspecified depression type   [facility] [place], Wendee Beavers, [place]   1 year ago [place], [place], [place]   1 year ago Acute recurrent maxillary sinusitis   [place], Vickki Muff, PA-C   1 year ago Annual physical exam   [facility] Trinna Post, [place]       Future Appointments             In 2 weeks Chrismon, Vickki Muff, PA-C [employer], [facility]            Passed - Completed PHQ-2 or PHQ-9 in the last 360 days

## 2020-12-15 ENCOUNTER — Other Ambulatory Visit: Payer: Self-pay

## 2020-12-15 ENCOUNTER — Encounter: Payer: Self-pay | Admitting: Family Medicine

## 2020-12-15 ENCOUNTER — Ambulatory Visit: Payer: 59 | Admitting: Family Medicine

## 2020-12-15 VITALS — BP 138/74 | HR 73 | Wt 213.0 lb

## 2020-12-15 DIAGNOSIS — E039 Hypothyroidism, unspecified: Secondary | ICD-10-CM | POA: Diagnosis not present

## 2020-12-15 DIAGNOSIS — I10 Essential (primary) hypertension: Secondary | ICD-10-CM | POA: Diagnosis not present

## 2020-12-15 NOTE — Progress Notes (Signed)
Established patient visit   Patient: Virginia Martin   DOB: January 29, 1966   55 y.o. Female  MRN: [id number] Visit Date: 12/15/2020  Today's healthcare provider: Vernie Murders, PA-C   Chief Complaint  Patient presents with   Hypertension   Subjective    HPI  Hypertension, follow-up  BP Readings from Last 3 Encounters:  12/15/20 138/74  05/05/20 (!) 157/106  03/11/20 (!) 149/87   Wt Readings from Last 3 Encounters:  12/15/20 213 lb (96.6 kg)  05/05/20 210 lb 12.8 oz (95.6 kg)  03/11/20 211 lb 6.4 oz (95.9 kg)     She was last seen for hypertension 8 months ago.  BP at that visit was 157/106. Management since that visit includes starting amlodipine '10mg'$  daily.  She reports excellent compliance with treatment. She is not having side effects.  She is following a Low Sodium diet. She is exercising. She does not smoke.  Use of agents associated with hypertension: none.   Outside blood pressures are 140's/90's. Symptoms: Yes chest pain No chest pressure  No palpitations No syncope  No dyspnea No orthopnea  No paroxysmal nocturnal dyspnea No lower extremity edema   Pertinent labs: Lab Results  Component Value Date   CHOL 243 (H) 03/11/2020   HDL 51 03/11/2020   LDLCALC 154 (H) 03/11/2020   TRIG 206 (H) 03/11/2020   CHOLHDL 4.8 (H) 03/11/2020   Lab Results  Component Value Date   NA 141 03/11/2020   K 4.4 03/11/2020   CREATININE 0.86 03/11/2020   GFRNONAA 77 03/11/2020   GFRAA 89 03/11/2020   GLUCOSE 131 (H) 03/11/2020     The 10-year ASCVD risk score (Arnett DK, et al., 2019) is: 3.5%   --------------------------------------------------------------------------------------------------- Past Medical History:  Diagnosis Date   Cancer ([place])    Depression    Thyroid disease    Past Surgical History:  Procedure Laterality Date   ABDOMINAL HYSTERECTOMY  2002   APPENDECTOMY  2002   BREAST BIOPSY  2006   INCISION AND DRAINAGE ABSCESS N/A 07/25/2015    Procedure: INCISION AND DRAINAGE ABSCESS / GLUTEAL;  Surgeon: Clayburn Pert, MD;  Location: [facility] ORS;  Service: General;  Laterality: N/A;   WRIST SURGERY Left 1991-1993   Family History  Problem Relation Age of Onset   Depression Mother    Colon polyps Mother    Heart attack Father    Hypertension Father    Diabetes Father    Depression Sister    Diabetes Maternal Grandmother    Cancer Maternal Grandmother    COPD Paternal Grandmother    Social History   Tobacco Use   Smoking status: Never   Smokeless tobacco: Never  Substance Use Topics   Alcohol use: No    Alcohol/week: 0.0 standard drinks   Drug use: No   Allergies  Allergen Reactions   Clindamycin Hcl Diarrhea    Other Reaction: GI trouble-hospitalized   Codeine Rash   Amoxicillin Itching    Other reaction(s): Other (See Comments) Other Reaction: blisters   Diphenhydramine Hcl Other (See Comments)    Other Reaction: racing heart   Gabapentin Rash    Other Reaction: red skin   Medications: Outpatient Medications Prior to Visit  Medication Sig   amLODipine (NORVASC) 10 MG tablet Take 1/2 tablet daily for two weeks. If blood pressure >140/90, please increase to 1 tablet daily.   ibuprofen (ADVIL,MOTRIN) 600 MG tablet Take 1 tablet (600 mg total) by mouth every 6 (six) hours as  needed.   levothyroxine (SYNTHROID) 50 MCG tablet TAKE 1 TABLET BY MOUTH EVERY DAY   venlafaxine XR (EFFEXOR-XR) 150 MG 24 hr capsule Take 1 capsule (150 mg total) by mouth daily.   hydrOXYzine (ATARAX/VISTARIL) 10 MG tablet Take 1 tablet (10 mg total) by mouth 3 (three) times daily as needed.   No facility-administered medications prior to visit.    Review of Systems  Constitutional:  Positive for fatigue. Negative for activity change, appetite change, chills, diaphoresis, fever and unexpected weight change.  Respiratory: Negative.    Cardiovascular:  Positive for chest pain and palpitations. Negative for leg swelling.   Allergic/Immunologic: Positive for environmental allergies.  Neurological:  Positive for headaches. Negative for dizziness and light-headedness.      Objective    BP 138/74 (BP Location: Right Arm, Patient Position: Sitting, Cuff Size: Large)   Pulse 73   Wt 213 lb (96.6 kg)   SpO2 96%   BMI 36.56 kg/m  BP Readings from Last 3 Encounters:  12/15/20 138/74  05/05/20 (!) 157/106  03/11/20 (!) 149/87   Wt Readings from Last 3 Encounters:  12/15/20 213 lb (96.6 kg)  05/05/20 210 lb 12.8 oz (95.6 kg)  03/11/20 211 lb 6.4 oz (95.9 kg)   Physical Exam Constitutional:      General: She is not in acute distress.    Appearance: She is well-developed.  HENT:     Head: Normocephalic and atraumatic.     Right Ear: Hearing and tympanic membrane normal.     Left Ear: Hearing and tympanic membrane normal.     Nose: Nose normal.     Mouth/Throat:     Pharynx: Oropharynx is clear.  Eyes:     General: Lids are normal. No scleral icterus.       Right eye: No discharge.        Left eye: No discharge.     Conjunctiva/sclera: Conjunctivae normal.  Cardiovascular:     Rate and Rhythm: Normal rate and regular rhythm.     Heart sounds: Normal heart sounds.  Pulmonary:     Effort: Pulmonary effort is normal. No respiratory distress.     Breath sounds: Normal breath sounds.  Abdominal:     General: Bowel sounds are normal.     Palpations: Abdomen is soft.  Musculoskeletal:        General: Normal range of motion.     Cervical back: Normal range of motion and neck supple.  Skin:    Findings: No lesion or rash.  Neurological:     Mental Status: She is alert and oriented to person, place, and time.  Psychiatric:        Speech: Speech normal.        Behavior: Behavior normal.        Thought Content: Thought content normal.      No results found for any visits on 12/15/20.  Assessment & Plan     1. Primary hypertension Well controlled BP on the Amlodipine 10 mg qd. Thought she was  having a "tapping" sensation in her chest without dyspnea, diaphoresis or peripheral edema. EKG shows NSR. Will check follow up labs. - EKG 12-Lead - CBC with Differential/Platelet - Comprehensive metabolic panel - TSH  2. Hypothyroidism, unspecified type Presently on Levothyroxine 50 mcg qd. No tremor or exophthalmos. No nodules or hair loss. Check labs to see if adjustments needed. - CBC with Differential/Platelet - Comprehensive metabolic panel - T4 - TSH   No follow-ups on file.  I, Dannelle Rhymes, PA-C, have reviewed all documentation for this visit. The documentation on 12/15/20 for the exam, diagnosis, procedures, and orders are all accurate and complete.    Vernie Murders, PA-C  [employer] [phone] (phone) [phone] (fax)  [place]

## 2020-12-16 LAB — COMPREHENSIVE METABOLIC PANEL
ALT: 72 IU/L — ABNORMAL HIGH (ref 0–32)
AST: 35 IU/L (ref 0–40)
Albumin/Globulin Ratio: 1.6 (ref 1.2–2.2)
Albumin: 4.7 g/dL (ref 3.8–4.9)
Alkaline Phosphatase: 138 IU/L — ABNORMAL HIGH (ref 44–121)
BUN/Creatinine Ratio: 18 (ref 9–23)
BUN: 17 mg/dL (ref 6–24)
Bilirubin Total: 0.4 mg/dL (ref 0.0–1.2)
CO2: 24 mmol/L (ref 20–29)
Calcium: 10.1 mg/dL (ref 8.7–10.2)
Chloride: 101 mmol/L (ref 96–106)
Creatinine, Ser: 0.95 mg/dL (ref 0.57–1.00)
Globulin, Total: 3 g/dL (ref 1.5–4.5)
Glucose: 142 mg/dL — ABNORMAL HIGH (ref 65–99)
Potassium: 4.6 mmol/L (ref 3.5–5.2)
Sodium: 142 mmol/L (ref 134–144)
Total Protein: 7.7 g/dL (ref 6.0–8.5)
eGFR: 72 mL/min/{1.73_m2} (ref >59)

## 2020-12-16 LAB — CBC WITH DIFFERENTIAL/PLATELET
Basophils Absolute: 0.1 10*3/uL (ref 0.0–0.2)
Basos: 1 %
EOS (ABSOLUTE): 0.1 10*3/uL (ref 0.0–0.4)
Eos: 1 %
Hematocrit: 43.8 % (ref 34.0–46.6)
Hemoglobin: 14.7 g/dL (ref 11.1–15.9)
Immature Grans (Abs): 0 10*3/uL (ref 0.0–0.1)
Immature Granulocytes: 0 %
Lymphocytes Absolute: 2 10*3/uL (ref 0.7–3.1)
Lymphs: 27 %
MCH: 29.3 pg (ref 26.6–33.0)
MCHC: 33.6 g/dL (ref 31.5–35.7)
MCV: 87 fL (ref 79–97)
Monocytes Absolute: 0.6 10*3/uL (ref 0.1–0.9)
Monocytes: 8 %
Neutrophils Absolute: 4.5 10*3/uL (ref 1.4–7.0)
Neutrophils: 63 %
Platelets: 263 10*3/uL (ref 150–450)
RBC: 5.02 x10E6/uL (ref 3.77–5.28)
RDW: 12.7 % (ref 11.7–15.4)
WBC: 7.2 10*3/uL (ref 3.4–10.8)

## 2020-12-16 LAB — TSH: TSH: 3.85 u[IU]/mL (ref 0.450–4.500)

## 2020-12-16 LAB — T4: T4, Total: 7.2 ug/dL (ref 4.5–12.0)

## 2020-12-20 ENCOUNTER — Telehealth: Payer: Self-pay | Admitting: *Deleted

## 2020-12-20 NOTE — Telephone Encounter (Signed)
Pt returned call for lab results.  Per agent CRM placed NT may give results.  Pt states labs were fasting.  Attempted to schedule 01/24/21 per pt's request , earliest appt but blocked same day. After hours call. Please advise:  CB# [phone]

## 2020-12-21 NOTE — Telephone Encounter (Signed)
Left VM to call back to schedule appt.  If [facility] is not able to schedule appt connect to office.

## 2021-01-04 ENCOUNTER — Other Ambulatory Visit: Payer: Self-pay | Admitting: Family Medicine

## 2021-01-04 DIAGNOSIS — F419 Anxiety disorder, unspecified: Secondary | ICD-10-CM

## 2021-01-04 NOTE — Telephone Encounter (Signed)
Requested medication (s) are due for refill today - yes  Requested medication (s) are on the active medication list -yes  Future visit scheduled -no  Last refill: 12/05/20 #30  Notes to clinic: Request RF: patient had appointment 2 weeks ago- depression/medication not addressed. Pharmacy request 90 day Rx, Dx code  Requested Prescriptions  Pending Prescriptions Disp Refills   venlafaxine XR (EFFEXOR-XR) 150 MG 24 hr capsule [Pharmacy Med Name: VENLAFAXINE HCL ER 150 MG CAP] 90 capsule 1    Sig: TAKE 1 CAPSULE BY MOUTH EVERY DAY     Psychiatry: Antidepressants - SNRI - desvenlafaxine & venlafaxine Failed - 01/04/2021  9:32 AM      Failed - LDL in normal range and within 360 days    LDL Chol Calc (NIH)  Date Value Ref Range Status  03/11/2020 154 (H) 0 - 99 mg/dL Final          Failed - Total Cholesterol in normal range and within 360 days    Cholesterol, Total  Date Value Ref Range Status  03/11/2020 243 (H) 100 - 199 mg/dL Final          Failed - Triglycerides in normal range and within 360 days    Triglycerides  Date Value Ref Range Status  03/11/2020 206 (H) 0 - 149 mg/dL Final          Passed - Completed PHQ-2 or PHQ-9 in the last 360 days      Passed - Last BP in normal range    BP Readings from Last 1 Encounters:  12/15/20 138/74          Passed - Valid encounter within last 6 months    Recent Outpatient Visits           2 weeks ago Primary hypertension   [facility], Vickki Muff, PA-C   8 months ago Hypothyroidism, unspecified type   [place], Adriana M, PA-C   9 months ago Depression, unspecified depression type   [place], [place], [place]   1 year ago [place], [place], [place]   1 year ago Acute recurrent maxillary sinusitis   [facility], Vickki Muff, PA-C                 Requested Prescriptions  Pending  Prescriptions Disp Refills   venlafaxine XR (EFFEXOR-XR) 150 MG 24 hr capsule [Pharmacy Med Name: VENLAFAXINE HCL ER 150 MG CAP] 90 capsule 1    Sig: TAKE 1 CAPSULE BY MOUTH EVERY DAY     Psychiatry: Antidepressants - SNRI - desvenlafaxine & venlafaxine Failed - 01/04/2021  9:32 AM      Failed - LDL in normal range and within 360 days    LDL Chol Calc (NIH)  Date Value Ref Range Status  03/11/2020 154 (H) 0 - 99 mg/dL Final          Failed - Total Cholesterol in normal range and within 360 days    Cholesterol, Total  Date Value Ref Range Status  03/11/2020 243 (H) 100 - 199 mg/dL Final          Failed - Triglycerides in normal range and within 360 days    Triglycerides  Date Value Ref Range Status  03/11/2020 206 (H) 0 - 149 mg/dL Final          Passed - Completed PHQ-2 or PHQ-9 in the last 360 days  Passed - Last BP in normal range    BP Readings from Last 1 Encounters:  12/15/20 138/74          Passed - Valid encounter within last 6 months    Recent Outpatient Visits           2 weeks ago Primary hypertension   [place], Vickki Muff, PA-C   8 months ago Hypothyroidism, unspecified type   [place], [place], PA-C   9 months ago Depression, unspecified depression type   [place], Wendee Beavers, [place]   1 year ago [place], Wendee Beavers, [place]   1 year ago Acute recurrent maxillary sinusitis   [facility] [place], Vickki Muff, [place]

## 2021-01-11 ENCOUNTER — Other Ambulatory Visit: Payer: Self-pay | Admitting: Family Medicine

## 2021-01-11 DIAGNOSIS — I10 Essential (primary) hypertension: Secondary | ICD-10-CM

## 2021-01-11 MED ORDER — AMLODIPINE BESYLATE 10 MG PO TABS
ORAL_TABLET | ORAL | 1 refills | Status: DC
Start: 2021-01-11 — End: 2021-06-15

## 2021-01-11 NOTE — Telephone Encounter (Signed)
Copied from [place] [phone] [phone]. Topic: Quick Communication - Rx Refill/Question >> Jan 11, 2021  9:59 AM Leward Quan A wrote: Medication: amLODipine (NORVASC) 10 MG tablet Patient lost her pocketbook with medication in it. Have not had any today please advise  Has the patient contacted their pharmacy? Yes.   (Agent: If no, request that the patient contact the pharmacy for the refill.) (Agent: If yes, when and what did the pharmacy advise?)  Preferred Pharmacy (with phone number or street name): [id number] - [place], [address]  Phone:  [phone] Fax:  [phone]    Has the patient been seen for an appointment in the last year OR does the patient have an upcoming appointment? Yes.    Agent: Please be advised that RX refills may take up to 3 business days. We ask that you follow-up with your pharmacy.

## 2021-03-27 ENCOUNTER — Telehealth: Payer: 59 | Admitting: Physician Assistant

## 2021-03-27 DIAGNOSIS — J019 Acute sinusitis, unspecified: Secondary | ICD-10-CM | POA: Diagnosis not present

## 2021-03-27 DIAGNOSIS — B9689 Other specified bacterial agents as the cause of diseases classified elsewhere: Secondary | ICD-10-CM

## 2021-03-27 MED ORDER — DOXYCYCLINE HYCLATE 100 MG PO TABS: 100.0000 mg | ORAL_TABLET | Freq: Two times a day (BID) | ORAL | 0 refills | Status: DC

## 2021-03-27 NOTE — Progress Notes (Signed)

## 2021-04-18 ENCOUNTER — Other Ambulatory Visit: Payer: Self-pay | Admitting: Family Medicine

## 2021-04-18 DIAGNOSIS — F419 Anxiety disorder, unspecified: Secondary | ICD-10-CM

## 2021-04-18 NOTE — Telephone Encounter (Signed)
Requested medication (s) are due for refill today: yes  Requested medication (s) are on the active medication list: yes  Last refill:  01/21/21  Future visit scheduled: no  Notes to clinic: failed protocol due to last visit and labs that addressed med/dx 03/11/2020, no upcoming visit scheduled, please assess.   Requested Prescriptions  Pending Prescriptions Disp Refills   venlafaxine XR (EFFEXOR-XR) 150 MG 24 hr capsule [Pharmacy Med Name: VENLAFAXINE HCL ER 150 MG CAP] 90 capsule 0    Sig: TAKE 1 CAPSULE BY MOUTH EVERY DAY     Psychiatry: Antidepressants - SNRI - desvenlafaxine & venlafaxine Failed - 04/18/2021  1:38 AM      Failed - LDL in normal range and within 360 days    LDL Chol Calc (NIH)  Date Value Ref Range Status  03/11/2020 154 (H) 0 - 99 mg/dL Final          Failed - Total Cholesterol in normal range and within 360 days    Cholesterol, Total  Date Value Ref Range Status  03/11/2020 243 (H) 100 - 199 mg/dL Final          Failed - Triglycerides in normal range and within 360 days    Triglycerides  Date Value Ref Range Status  03/11/2020 206 (H) 0 - 149 mg/dL Final          Passed - Completed PHQ-2 or PHQ-9 in the last 360 days      Passed - Last BP in normal range    BP Readings from Last 1 Encounters:  12/15/20 138/74          Passed - Valid encounter within last 6 months    Recent Outpatient Visits           4 months ago Primary hypertension   [facility], Vickki Muff, PA-C   11 months ago Hypothyroidism, unspecified type   [place], [place], PA-C   1 year ago Depression, unspecified depression type   [place], Wendee Beavers, [place]   2 years ago [place], [place], [place]   2 years ago Acute recurrent maxillary sinusitis   [facility] [place], Vickki Muff, [place]

## 2021-06-14 NOTE — Progress Notes (Signed)
I,Sulibeya S Dimas,acting as a Education administrator for Lavon Paganini, MD.,have documented all relevant documentation on the behalf of Lavon Paganini, MD,as directed by  Lavon Paganini, MD while in the presence of Lavon Paganini, MD.  Established patient visit   Patient: Virginia Martin   DOB: 09-16-1965   56 y.o. Female  MRN: [id number] Visit Date: 06/15/2021  Today's healthcare provider: Lavon Paganini, MD   Chief Complaint  Patient presents with   Hypertension   Hypothyroidism   Subjective    HPI   Upper respiratory symptoms She complains of headache described as pressure and productive cough with  yellow colored sputum.with no fever, chills, night sweats or weight loss. Onset of symptoms was a few weeks ago and worsening.She is drinking plenty of fluids.  Past history is significant for no history of pneumonia or bronchitis. Patient is non-smoker.  Coricidin, Zyrtec, and decongestant medication  +postnasal drip ---------------------------------------------------------------------------------------------------  Hypertension, follow-up  BP Readings from Last 3 Encounters:  06/15/21 132/89  12/15/20 138/74  05/05/20 (!) 157/106   Wt Readings from Last 3 Encounters:  06/15/21 209 lb (94.8 kg)  12/15/20 213 lb (96.6 kg)  05/05/20 210 lb 12.8 oz (95.6 kg)     She was last seen for hypertension 16month ago.  BP at that visit was 138/74. Management since that visit includes no changes.  She reports excellent compliance with treatment. She is not having side effects.  She is following a Regular diet. She is not exercising. She does not smoke.  Use of agents associated with hypertension: decongestants.   Outside blood pressures are elevated 170s/90-100s. Symptoms: Yes chest pain x 2 months No chest pressure  No palpitations No syncope  No dyspnea No orthopnea  No paroxysmal nocturnal dyspnea Yes lower extremity edema   Pertinent labs: Lab Results  Component  Value Date   CHOL 243 (H) 03/11/2020   HDL 51 03/11/2020   LDLCALC 154 (H) 03/11/2020   TRIG 206 (H) 03/11/2020   CHOLHDL 4.8 (H) 03/11/2020   Lab Results  Component Value Date   NA 142 12/15/2020   K 4.6 12/15/2020   CREATININE 0.95 12/15/2020   EGFR 72 12/15/2020   GLUCOSE 142 (H) 12/15/2020   TSH 3.850 12/15/2020     The 10-year ASCVD risk score (Arnett DK, et al., 2019) is: 3.5%   ---------------------------------------------------------------------------------------------------  Hypothyroid, follow-up  Lab Results  Component Value Date   TSH 3.850 12/15/2020   TSH 4.430 05/05/2020   TSH 8.170 (H) 03/11/2020   FREET4 0.84 04/12/2017   T4TOTAL 7.2 12/15/2020    Wt Readings from Last 3 Encounters:  06/15/21 209 lb (94.8 kg)  12/15/20 213 lb (96.6 kg)  05/05/20 210 lb 12.8 oz (95.6 kg)    She was last seen for hypothyroid 6 months ago.  Management since that visit includes no changes. She reports excellent compliance with treatment. She is not having side effects.   Symptoms: No change in energy level No constipation  No diarrhea No heat / cold intolerance  No nervousness No palpitations  No weight changes    -----------------------------------------------------------------------------------------  Medications: Outpatient Medications Prior to Visit  Medication Sig   ibuprofen (ADVIL,MOTRIN) 600 MG tablet Take 1 tablet (600 mg total) by mouth every 6 (six) hours as needed.   [DISCONTINUED] amLODipine (NORVASC) 10 MG tablet Take 1/2 tablet daily for two weeks. If blood pressure >140/90, please increase to 1 tablet daily.   [DISCONTINUED] levothyroxine (SYNTHROID) 50 MCG tablet TAKE 1 TABLET  BY MOUTH EVERY DAY   [DISCONTINUED] venlafaxine XR (EFFEXOR-XR) 150 MG 24 hr capsule TAKE 1 CAPSULE BY MOUTH EVERY DAY   [DISCONTINUED] doxycycline (VIBRA-TABS) 100 MG tablet Take 1 tablet (100 mg total) by mouth 2 (two) times daily. (Patient not taking: Reported on  06/15/2021)   [DISCONTINUED] hydrOXYzine (ATARAX/VISTARIL) 10 MG tablet Take 1 tablet (10 mg total) by mouth 3 (three) times daily as needed. (Patient not taking: Reported on 06/15/2021)   No facility-administered medications prior to visit.    Review of Systems  Constitutional:  Positive for fatigue. Negative for appetite change and unexpected weight change.  HENT:  Positive for ear discharge and postnasal drip. Negative for ear pain and sore throat.   Respiratory:  Positive for cough. Negative for shortness of breath and wheezing.   Cardiovascular:  Positive for chest pain and leg swelling. Negative for palpitations.  Neurological:  Positive for headaches.  Psychiatric/Behavioral:  Positive for sleep disturbance.        Objective    BP 132/89 (BP Location: Left Arm, Patient Position: Sitting, Cuff Size: Large)    Pulse 74    Temp 97.9 F (36.6 C) (Temporal)    Resp 16    Wt 209 lb (94.8 kg)    BMI 35.87 kg/m  BP Readings from Last 3 Encounters:  06/15/21 132/89  12/15/20 138/74  05/05/20 (!) 157/106   Wt Readings from Last 3 Encounters:  06/15/21 209 lb (94.8 kg)  12/15/20 213 lb (96.6 kg)  05/05/20 210 lb 12.8 oz (95.6 kg)      Physical Exam Vitals reviewed.  Constitutional:      General: She is not in acute distress.    Appearance: Normal appearance. She is well-developed. She is not diaphoretic.  HENT:     Head: Normocephalic and atraumatic.  Eyes:     General: No scleral icterus.    Conjunctiva/sclera: Conjunctivae normal.  Neck:     Thyroid: No thyromegaly.  Cardiovascular:     Rate and Rhythm: Normal rate and regular rhythm.     Pulses: Normal pulses.     Heart sounds: Normal heart sounds. No murmur heard. Pulmonary:     Effort: Pulmonary effort is normal. No respiratory distress.     Breath sounds: Normal breath sounds. No wheezing, rhonchi or rales.  Musculoskeletal:     Cervical back: Neck supple.     Right lower leg: No edema.     Left lower leg: No  edema.  Lymphadenopathy:     Cervical: No cervical adenopathy.  Skin:    General: Skin is warm and dry.     Findings: No rash.  Neurological:     Mental Status: She is alert and oriented to person, place, and time. Mental status is at baseline.  Psychiatric:        Mood and Affect: Mood normal.        Behavior: Behavior normal.      No results found for any visits on 06/15/21.  Assessment & Plan     Problem List Items Addressed This Visit       Cardiovascular and Mediastinum   Primary hypertension - Primary    Well controlled Continue current medications Recheck metabolic panel F/u in 6 months       Relevant Medications   amLODipine (NORVASC) 10 MG tablet   Other Relevant Orders   Comprehensive metabolic panel     Respiratory   Allergic rhinitis with postnasal drip    Chronic and uncontrolled Continue  zyrtec Stop decongestants Add flonase daily        Endocrine   Hypothyroidism    Previously well controlled Continue Synthroid at current dose  Recheck TSH and adjust Synthroid as indicated        Relevant Medications   levothyroxine (SYNTHROID) 50 MCG tablet   Other Relevant Orders   TSH     Other   Depression    Chronic and stable Continue effexor at current dose      Relevant Medications   venlafaxine XR (EFFEXOR-XR) 150 MG 24 hr capsule   Mixed hyperlipidemia    Reviewed last lipid panel Not currently on a statin Recheck FLP and CMP Discussed diet and exercise       Relevant Medications   amLODipine (NORVASC) 10 MG tablet   Other Relevant Orders   Comprehensive metabolic panel   Lipid panel   Morbid obesity ([place])    BMI >35 and associated with HTN and HLD Wants to consider Ozempic vs Mounjaro Discussed insurance coverage limitations Will consider pending A1c Discussed importance of healthy weight management Discussed diet and exercise       Other Visit Diagnoses     Hyperglycemia       Relevant Orders   Hemoglobin A1c   Acute  anxiety       Relevant Medications   venlafaxine XR (EFFEXOR-XR) 150 MG 24 hr capsule        Return in about 6 months (around 12/16/2021) for CPE.      I, Lavon Paganini, MD, have reviewed all documentation for this visit. The documentation on 06/15/21 for the exam, diagnosis, procedures, and orders are all accurate and complete.   Davi Kroon, Dionne Bucy, MD, MPH [facility]

## 2021-06-15 ENCOUNTER — Encounter: Payer: Self-pay | Admitting: Family Medicine

## 2021-06-15 ENCOUNTER — Ambulatory Visit: Payer: 59 | Admitting: Family Medicine

## 2021-06-15 ENCOUNTER — Other Ambulatory Visit: Payer: Self-pay

## 2021-06-15 VITALS — BP 132/89 | HR 74 | Temp 97.9°F | Resp 16 | Wt 209.0 lb

## 2021-06-15 DIAGNOSIS — I10 Essential (primary) hypertension: Secondary | ICD-10-CM

## 2021-06-15 DIAGNOSIS — E1169 Type 2 diabetes mellitus with other specified complication: Secondary | ICD-10-CM | POA: Insufficient documentation

## 2021-06-15 DIAGNOSIS — R739 Hyperglycemia, unspecified: Secondary | ICD-10-CM

## 2021-06-15 DIAGNOSIS — R0982 Postnasal drip: Secondary | ICD-10-CM

## 2021-06-15 DIAGNOSIS — E039 Hypothyroidism, unspecified: Secondary | ICD-10-CM

## 2021-06-15 DIAGNOSIS — F419 Anxiety disorder, unspecified: Secondary | ICD-10-CM

## 2021-06-15 DIAGNOSIS — F3342 Major depressive disorder, recurrent, in full remission: Secondary | ICD-10-CM | POA: Diagnosis not present

## 2021-06-15 DIAGNOSIS — E669 Obesity, unspecified: Secondary | ICD-10-CM | POA: Insufficient documentation

## 2021-06-15 DIAGNOSIS — E782 Mixed hyperlipidemia: Secondary | ICD-10-CM

## 2021-06-15 DIAGNOSIS — J309 Allergic rhinitis, unspecified: Secondary | ICD-10-CM | POA: Insufficient documentation

## 2021-06-15 MED ORDER — LEVOTHYROXINE SODIUM 50 MCG PO TABS: 50.0000 ug | ORAL_TABLET | Freq: Every day | ORAL | 3 refills | Status: DC

## 2021-06-15 MED ORDER — AMLODIPINE BESYLATE 10 MG PO TABS: 10.0000 mg | ORAL_TABLET | Freq: Every day | ORAL | 1 refills | Status: DC

## 2021-06-15 MED ORDER — VENLAFAXINE HCL ER 150 MG PO CP24: 150.0000 mg | ORAL_CAPSULE | Freq: Every day | ORAL | 3 refills | Status: DC

## 2021-06-15 NOTE — Assessment & Plan Note (Signed)
BMI >35 and associated with HTN and HLD ?Wants to consider Ozempic vs Mounjaro ?Discussed insurance coverage limitations ?Will consider pending A1c ?Discussed importance of healthy weight management ?Discussed diet and exercise  ?

## 2021-06-15 NOTE — Assessment & Plan Note (Signed)
Chronic and uncontrolled ?Continue zyrtec ?Stop decongestants ?Add flonase daily ?

## 2021-06-15 NOTE — Assessment & Plan Note (Signed)
Chronic and stable ?Continue effexor at current dose ?

## 2021-06-15 NOTE — Assessment & Plan Note (Signed)
Well controlled Continue current medications Recheck metabolic panel F/u in 6 months  

## 2021-06-15 NOTE — Assessment & Plan Note (Signed)
Previously well controlled Continue Synthroid at current dose  Recheck TSH and adjust Synthroid as indicated   

## 2021-06-15 NOTE — Assessment & Plan Note (Signed)
Reviewed last lipid panel Not currently on a statin Recheck FLP and CMP Discussed diet and exercise  

## 2021-06-16 ENCOUNTER — Telehealth: Payer: Self-pay

## 2021-06-16 DIAGNOSIS — I10 Essential (primary) hypertension: Secondary | ICD-10-CM

## 2021-06-16 DIAGNOSIS — R739 Hyperglycemia, unspecified: Secondary | ICD-10-CM

## 2021-06-16 LAB — LIPID PANEL
Chol/HDL Ratio: 4.8 ratio — ABNORMAL HIGH (ref 0.0–4.4)
Cholesterol, Total: 227 mg/dL — ABNORMAL HIGH (ref 100–199)
HDL: 47 mg/dL (ref >39)
LDL Chol Calc (NIH): 137 mg/dL — ABNORMAL HIGH (ref 0–99)
Triglycerides: 241 mg/dL — ABNORMAL HIGH (ref 0–149)
VLDL Cholesterol Cal: 43 mg/dL — ABNORMAL HIGH (ref 5–40)

## 2021-06-16 LAB — COMPREHENSIVE METABOLIC PANEL
ALT: 68 IU/L — ABNORMAL HIGH (ref 0–32)
AST: 42 IU/L — ABNORMAL HIGH (ref 0–40)
Albumin/Globulin Ratio: 1.4 (ref 1.2–2.2)
Albumin: 4.6 g/dL (ref 3.8–4.9)
Alkaline Phosphatase: 124 IU/L — ABNORMAL HIGH (ref 44–121)
BUN/Creatinine Ratio: 14 (ref 9–23)
BUN: 13 mg/dL (ref 6–24)
Bilirubin Total: 0.4 mg/dL (ref 0.0–1.2)
CO2: 27 mmol/L (ref 20–29)
Calcium: 10.1 mg/dL (ref 8.7–10.2)
Chloride: 100 mmol/L (ref 96–106)
Creatinine, Ser: 0.91 mg/dL (ref 0.57–1.00)
Globulin, Total: 3.4 g/dL (ref 1.5–4.5)
Glucose: 148 mg/dL — ABNORMAL HIGH (ref 70–99)
Potassium: 4.8 mmol/L (ref 3.5–5.2)
Sodium: 142 mmol/L (ref 134–144)
Total Protein: 8 g/dL (ref 6.0–8.5)
eGFR: 75 mL/min/{1.73_m2} (ref >59)

## 2021-06-16 LAB — TSH: TSH: 5.59 u[IU]/mL — ABNORMAL HIGH (ref 0.450–4.500)

## 2021-06-16 LAB — HEMOGLOBIN A1C
Est. average glucose Bld gHb Est-mCnc: 157 mg/dL
Hgb A1c MFr Bld: 7.1 % — ABNORMAL HIGH (ref 4.8–5.6)

## 2021-06-16 MED ORDER — OZEMPIC (0.25 OR 0.5 MG/DOSE) 2 MG/1.5ML ~~LOC~~ SOPN
0.5000 mg | PEN_INJECTOR | SUBCUTANEOUS | 1 refills | Status: DC
Start: 2021-06-16 — End: 2021-07-12

## 2021-06-16 NOTE — Telephone Encounter (Signed)
-----   Message from Virginia Crews, MD sent at 06/16/2021  8:04 AM EST ----- ?Stable elevation of liver function tests for the last 6 months.  Does not appear that patient has had a ultrasound of her liver to evaluate this further.  Would recommend holding any Tylenol and alcohol and hydrating well and then rechecking liver function tests in about 1 month.  If they are still elevated, would recommend right upper quadrant abdominal ultrasound. ? ?Cholesterol is elevated. The 10-year ASCVD (heart disease and stroke) risk score (Arnett DK, et al., 2019) is: 3.5%, which is low.  No medication needed at this time, but I do recommend diet low in saturated fat and regular exercise - 30 min at least 5 times per week. ? ?Hemoglobin A1c, 69-monthaverage of blood sugars, is now in the diabetic range.  Patient was interested in medication for weight loss, so we could use Ozempic to lower her blood sugar and help with weight loss.  If she is interested, start with Ozempic 0.25 mg subcu once weekly for 2 weeks and then increase to 0.5 mg weekly.  She will need to follow-up in 3 months and recheck A1c at that visit. ? ?TSH, thyroid hormone, is elevated.  If she still taking her Synthroid as prescribed?  If she is, we should increase her dose to 75 mcg daily.  If she is not taking it regularly, please resume.  We will recheck her TSH at her next visit. ? ?Okay to send in new prescriptions as above if patient agrees. ?

## 2021-06-16 NOTE — Progress Notes (Unsigned)
Request Reference Number: [id number]. OZEMPIC INJ 2/1.5ML is approved through 06/17/2022. Your patient may now fill this prescription and it will be covered. ?

## 2021-06-16 NOTE — Telephone Encounter (Signed)
-----   Message from Virginia Crews, MD sent at 06/16/2021  8:04 AM EST ----- ?Stable elevation of liver function tests for the last 6 months.  Does not appear that patient has had a ultrasound of her liver to evaluate this further.  Would recommend holding any Tylenol and alcohol and hydrating well and then rechecking liver function tests in about 1 month.  If they are still elevated, would recommend right upper quadrant abdominal ultrasound. ? ?Cholesterol is elevated. The 10-year ASCVD (heart disease and stroke) risk score (Arnett DK, et al., 2019) is: 3.5%, which is low.  No medication needed at this time, but I do recommend diet low in saturated fat and regular exercise - 30 min at least 5 times per week. ? ?Hemoglobin A1c, 31-monthaverage of blood sugars, is now in the diabetic range.  Patient was interested in medication for weight loss, so we could use Ozempic to lower her blood sugar and help with weight loss.  If she is interested, start with Ozempic 0.25 mg subcu once weekly for 2 weeks and then increase to 0.5 mg weekly.  She will need to follow-up in 3 months and recheck A1c at that visit. ? ?TSH, thyroid hormone, is elevated.  If she still taking her Synthroid as prescribed?  If she is, we should increase her dose to 75 mcg daily.  If she is not taking it regularly, please resume.  We will recheck her TSH at her next visit. ? ?Okay to send in new prescriptions as above if patient agrees. ?

## 2021-06-20 ENCOUNTER — Telehealth: Payer: 59 | Admitting: Physician Assistant

## 2021-06-20 DIAGNOSIS — H1032 Unspecified acute conjunctivitis, left eye: Secondary | ICD-10-CM | POA: Diagnosis not present

## 2021-06-20 MED ORDER — POLYMYXIN B-TRIMETHOPRIM 10000-0.1 UNIT/ML-% OP SOLN: OPHTHALMIC | 0 refills | Status: DC

## 2021-06-20 NOTE — Progress Notes (Signed)
?Virtual Visit Consent  ? ?Virginia Martin, you are scheduled for a virtual visit with a [facility] provider today.   ?  ?Just as with appointments in the office, your consent must be obtained to participate.  Your consent will be active for this visit and any virtual visit you may have with one of our providers in the next 365 days.   ?  ?If you have a MyChart account, a copy of this consent can be sent to you electronically.  All virtual visits are billed to your insurance company just like a traditional visit in the office.   ? ?As this is a virtual visit, video technology does not allow for your provider to perform a traditional examination.  This may limit your provider's ability to fully assess your condition.  If your provider identifies any concerns that need to be evaluated in person or the need to arrange testing (such as labs, EKG, etc.), we will make arrangements to do so.   ?  ?Although advances in technology are sophisticated, we cannot ensure that it will always work on either your end or our end.  If the connection with a video visit is poor, the visit may have to be switched to a telephone visit.  With either a video or telephone visit, we are not always able to ensure that we have a secure connection.    ? ?I need to obtain your verbal consent now.   Are you willing to proceed with your visit today?  ?  ?SALAYA HOLTROP has provided verbal consent on 06/20/2021 for a virtual visit (video or telephone). ?  ?Leeanne Rio, PA-C  ? ?Date: 06/20/2021 4:38 PM ? ? ?Virtual Visit via Video Note  ? ?ILeeanne Rio, connected with  DELAINA FETSCH  ([id number], January 01, 1966) on 06/20/21 at  4:30 PM EDT by a video-enabled telemedicine application and verified that I am speaking with the correct person using two identifiers. ? ?Location: ?Patient: Virtual Visit Location Patient: Home ?Provider: Virtual Visit Location Provider: Home Office ?  ?I discussed the limitations of evaluation and management by  telemedicine and the availability of in person appointments. The patient expressed understanding and agreed to proceed.   ? ?History of Present Illness: ?Virginia Martin is a 56 y.o. who identifies as a female who was assigned female at birth, and is being seen today for possible pink eye. Notes Sunday into yesterday some drainage from L eye. Now with redness of the eye and mild discomfort. Crusting of the eye on waking in the morning. Denies eye pain, vision change. Denies symptoms of R eye. Denies fever, chills. Denies light sensitivity. Notes she does not wear contacts.  ? ?HPI: HPI  ?Problems:  ?Patient Active Problem List  ? Diagnosis Date Noted  ? Mixed hyperlipidemia 06/15/2021  ? Morbid obesity ([place]) 06/15/2021  ? Allergic rhinitis with postnasal drip 06/15/2021  ? Primary hypertension 05/05/2020  ? Hypothyroidism 05/05/2020  ? Depression 02/16/2015  ? Panic attacks 02/16/2015  ?  ?Allergies:  ?Allergies  ?Allergen Reactions  ? Clindamycin Hcl Diarrhea  ?  Other Reaction: GI trouble-hospitalized  ? Codeine Rash  ? Amoxicillin Itching  ?  Other reaction(s): Other (See Comments) ?Other Reaction: blisters  ? Diphenhydramine Hcl Other (See Comments)  ?  Other Reaction: racing heart  ? Gabapentin Rash  ?  Other Reaction: red skin  ? ?Medications:  ?Current Outpatient Medications:  ?  trimethoprim-polymyxin b (POLYTRIM) ophthalmic solution, Apply 1-2 drops  into affected eye QID x 5 days., Disp: 10 mL, Rfl: 0 ?  amLODipine (NORVASC) 10 MG tablet, Take 1 tablet (10 mg total) by mouth daily., Disp: 90 tablet, Rfl: 1 ?  ibuprofen (ADVIL,MOTRIN) 600 MG tablet, Take 1 tablet (600 mg total) by mouth every 6 (six) hours as needed., Disp: 30 tablet, Rfl: 0 ?  levothyroxine (SYNTHROID) 50 MCG tablet, Take 1 tablet (50 mcg total) by mouth daily., Disp: 90 tablet, Rfl: 3 ?  Semaglutide,0.25 or 0.'5MG'$ /DOS, (OZEMPIC, 0.25 OR 0.5 MG/DOSE,) 2 MG/1.5ML SOPN, Inject 0.5 mg into the skin once a week. 0.25 mg for 2 weeks, then increase  0.5 mg weekly, Disp: 1.5 mL, Rfl: 1 ?  venlafaxine XR (EFFEXOR-XR) 150 MG 24 hr capsule, Take 1 capsule (150 mg total) by mouth daily., Disp: 90 capsule, Rfl: 3 ? ?Observations/Objective: ?Patient is well-developed, well-nourished in no acute distress.  ?Resting comfortably at home.  ?Head is normocephalic, atraumatic.  ?No labored breathing. ?Speech is clear and coherent with logical content.  ?Patient is alert and oriented at baseline.  ?L eye with noted conjunctival  injection. No noted lid swelling. EOMI. R eye within normal limits.  ? ?Assessment and Plan: ?1. Acute bacterial conjunctivitis of left eye ?- trimethoprim-polymyxin b (POLYTRIM) ophthalmic solution; Apply 1-2 drops into affected eye QID x 5 days.  Dispense: 10 mL; Refill: 0 ? ?Start Polytrim OP. Supportive measures including warm compresses reviewed. Hand hygiene reviewed. Strict in-person follow-up precautions discussed.  ? ?Follow Up Instructions: ?I discussed the assessment and treatment plan with the patient. The patient was provided an opportunity to ask questions and all were answered. The patient agreed with the plan and demonstrated an understanding of the instructions.  A copy of instructions were sent to the patient via MyChart unless otherwise noted below.  ? ?The patient was advised to call back or seek an in-person evaluation if the symptoms worsen or if the condition fails to improve as anticipated. ? ?Time:  ?I spent 10 minutes with the patient via telehealth technology discussing the above problems/concerns.   ? ?Leeanne Rio, PA-C ?

## 2021-06-20 NOTE — Patient Instructions (Signed)
?  Virginia Martin, thank you for joining Leeanne Rio, PA-C for today's virtual visit.  While this provider is not your primary care provider (PCP), if your PCP is located in our provider database this encounter information will be shared with them immediately following your visit. ? ?Consent: ?(Patient) SHALECE STAFFA provided verbal consent for this virtual visit at the beginning of the encounter. ? ?Current Medications: ? ?Current Outpatient Medications:  ?  trimethoprim-polymyxin b (POLYTRIM) ophthalmic solution, Apply 1-2 drops into affected eye QID x 5 days., Disp: 10 mL, Rfl: 0 ?  amLODipine (NORVASC) 10 MG tablet, Take 1 tablet (10 mg total) by mouth daily., Disp: 90 tablet, Rfl: 1 ?  ibuprofen (ADVIL,MOTRIN) 600 MG tablet, Take 1 tablet (600 mg total) by mouth every 6 (six) hours as needed., Disp: 30 tablet, Rfl: 0 ?  levothyroxine (SYNTHROID) 50 MCG tablet, Take 1 tablet (50 mcg total) by mouth daily., Disp: 90 tablet, Rfl: 3 ?  Semaglutide,0.25 or 0.'5MG'$ /DOS, (OZEMPIC, 0.25 OR 0.5 MG/DOSE,) 2 MG/1.5ML SOPN, Inject 0.5 mg into the skin once a week. 0.25 mg for 2 weeks, then increase 0.5 mg weekly, Disp: 1.5 mL, Rfl: 1 ?  venlafaxine XR (EFFEXOR-XR) 150 MG 24 hr capsule, Take 1 capsule (150 mg total) by mouth daily., Disp: 90 capsule, Rfl: 3  ? ?Medications ordered in this encounter:  ?Meds ordered this encounter  ?Medications  ? trimethoprim-polymyxin b (POLYTRIM) ophthalmic solution  ?  Sig: Apply 1-2 drops into affected eye QID x 5 days.  ?  Dispense:  10 mL  ?  Refill:  0  ?  Order Specific Question:   Supervising Provider  ?  Answer:   Noemi Chapel [[id number]]  ?  ? ?*If you need refills on other medications prior to your next appointment, please contact your pharmacy* ? ?Follow-Up: ?Call back or seek an in-person evaluation if the symptoms worsen or if the condition fails to improve as anticipated. ? ?Other Instructions ?Keep hands clean and dry. ?Avoid touching the eyes. ?Apply warm compresses to the  eye for 10 minutes, a few times per day. ?Use the Polytrim drops as directed. ? ?If not resolving or noting any new or worsening symptoms despite treatment, you need an in-person evaluation.  ? ? ?If you have been instructed to have an in-person evaluation today at a local Urgent Care facility, please use the link below. It will take you to a list of all of our available [facility], including address, phone number and hours of operation. Please do not delay care.  ?[facility] ? ?If you or a family member do not have a primary care provider, use the link below to schedule a visit and establish care. When you choose a [facility] primary care physician or advanced practice provider, you gain a long-term partner in health. ?Find a Primary Care Provider ? ?Learn more about [facility] in-office and virtual care options: ?[facility] Now  ?

## 2021-07-12 ENCOUNTER — Other Ambulatory Visit: Payer: Self-pay | Admitting: Family Medicine

## 2021-07-12 DIAGNOSIS — R739 Hyperglycemia, unspecified: Secondary | ICD-10-CM

## 2021-07-19 LAB — BASIC METABOLIC PANEL
BUN/Creatinine Ratio: 18 (ref 9–23)
BUN: 17 mg/dL (ref 6–24)
CO2: 25 mmol/L (ref 20–29)
Calcium: 9.8 mg/dL (ref 8.7–10.2)
Chloride: 101 mmol/L (ref 96–106)
Creatinine, Ser: 0.97 mg/dL (ref 0.57–1.00)
Glucose: 128 mg/dL — ABNORMAL HIGH (ref 70–99)
Potassium: 4.5 mmol/L (ref 3.5–5.2)
Sodium: 140 mmol/L (ref 134–144)
eGFR: 69 mL/min/{1.73_m2} (ref >59)

## 2021-08-24 ENCOUNTER — Telehealth: Payer: 59 | Admitting: Emergency Medicine

## 2021-08-24 DIAGNOSIS — J020 Streptococcal pharyngitis: Secondary | ICD-10-CM

## 2021-08-24 MED ORDER — AZITHROMYCIN 250 MG PO TABS: ORAL_TABLET | ORAL | 0 refills | Status: AC

## 2021-08-24 NOTE — Progress Notes (Signed)
Virtual Visit Consent   Virginia Martin, you are scheduled for a virtual visit with a [facility] provider today. Just as with appointments in the office, your consent must be obtained to participate. Your consent will be active for this visit and any virtual visit you may have with one of our providers in the next 365 days. If you have a MyChart account, a copy of this consent can be sent to you electronically.  As this is a virtual visit, video technology does not allow for your provider to perform a traditional examination. This may limit your provider's ability to fully assess your condition. If your provider identifies any concerns that need to be evaluated in person or the need to arrange testing (such as labs, EKG, etc.), we will make arrangements to do so. Although advances in technology are sophisticated, we cannot ensure that it will always work on either your end or our end. If the connection with a video visit is poor, the visit may have to be switched to a telephone visit. With either a video or telephone visit, we are not always able to ensure that we have a secure connection.  By engaging in this virtual visit, you consent to the provision of healthcare and authorize for your insurance to be billed (if applicable) for the services provided during this visit. Depending on your insurance coverage, you may receive a charge related to this service.  I need to obtain your verbal consent now. Are you willing to proceed with your visit today? PEBBLE BOTKIN has provided verbal consent on 08/24/2021 for a virtual visit (video or telephone). Carvel Getting, NP  Date: 08/24/2021 11:24 AM  Virtual Visit via Video Note   I, Carvel Getting, connected with  MARELYN ROUSER  ([id number], [birth year]) on 08/24/21 at 11:15 AM EDT by a video-enabled telemedicine application and verified that I am speaking with the correct person using two identifiers.  Location: Patient: Virtual Visit Location Patient: Other: in  a parked car in [place]. Provider: Virtual Visit Location Provider: Home Office   I discussed the limitations of evaluation and management by telemedicine and the availability of in person appointments. The patient expressed understanding and agreed to proceed.    History of Present Illness: Virginia Martin is a 56 y.o. who identifies as a female who was assigned female at birth, and is being seen today for possible strep throat.  She reports her grandchildren had strep throat a couple of weeks ago.  She developed a sore throat yesterday that is progressively rapidly worsened.  It hurts to swallow.  She looked at her throat this morning in the mirror with a flashlight and saw that it was very red and that she has white spots in it.  She has a temperature even when taking ibuprofen.  With ibuprofen her temperature was 100.3 F.  She denies any other symptoms no nasal congestion or sinus pressure or cough.  She feels she has strep.  HPI: HPI  Problems:  Patient Active Problem List   Diagnosis Date Noted   Mixed hyperlipidemia 06/15/2021   Morbid obesity ([place]) 06/15/2021   Allergic rhinitis with postnasal drip 06/15/2021   Primary hypertension 05/05/2020   Hypothyroidism 05/05/2020   Depression 02/16/2015   Panic attacks 02/16/2015    Allergies:  Allergies  Allergen Reactions   Clindamycin Hcl Diarrhea    Other Reaction: GI trouble-hospitalized   Codeine Rash   Amoxicillin Itching    Other reaction(s): Other (See  Comments) Other Reaction: blisters   Diphenhydramine Hcl Other (See Comments)    Other Reaction: racing heart   Gabapentin Rash    Other Reaction: red skin   Medications:  Current Outpatient Medications:    azithromycin (ZITHROMAX) 250 MG tablet, Take 2 tablets on day 1, then 1 tablet daily on days 2 through 5, Disp: 6 tablet, Rfl: 0   amLODipine (NORVASC) 10 MG tablet, Take 1 tablet (10 mg total) by mouth daily., Disp: 90 tablet, Rfl: 1   ibuprofen (ADVIL,MOTRIN) 600 MG tablet,  Take 1 tablet (600 mg total) by mouth every 6 (six) hours as needed., Disp: 30 tablet, Rfl: 0   levothyroxine (SYNTHROID) 50 MCG tablet, Take 1 tablet (50 mcg total) by mouth daily., Disp: 90 tablet, Rfl: 3   Semaglutide,0.25 or 0.'5MG'$ /DOS, (OZEMPIC, 0.25 OR 0.5 MG/DOSE,) 2 MG/3ML SOPN, Inject 0.5 mg into the muscle once a week., Disp: 3 mL, Rfl: 1   trimethoprim-polymyxin b (POLYTRIM) ophthalmic solution, Apply 1-2 drops into affected eye QID x 5 days., Disp: 10 mL, Rfl: 0   venlafaxine XR (EFFEXOR-XR) 150 MG 24 hr capsule, Take 1 capsule (150 mg total) by mouth daily., Disp: 90 capsule, Rfl: 3  Observations/Objective: Patient is well-developed, well-nourished in no acute distress.  Resting comfortably in her parked car.  Head is normocephalic, atraumatic.  No labored breathing.  Speech is clear and coherent with logical content.  Patient is alert and oriented at baseline.    Assessment and Plan: 1. Strep pharyngitis  Prescribed a Z-Pak as patient is allergic to amoxicillin.  Discussed supportive care measures.  Patient to change her toothbrush in 2 days.  Follow Up Instructions: I discussed the assessment and treatment plan with the patient. The patient was provided an opportunity to ask questions and all were answered. The patient agreed with the plan and demonstrated an understanding of the instructions.  A copy of instructions were sent to the patient via MyChart unless otherwise noted below.   The patient was advised to call back or seek an in-person evaluation if the symptoms worsen or if the condition fails to improve as anticipated.  Time:  I spent 10 minutes with the patient via telehealth technology discussing the above problems/concerns.    Carvel Getting, NP

## 2021-08-24 NOTE — Patient Instructions (Signed)
  Virginia Martin, thank you for joining Carvel Getting, NP for today's virtual visit.  While this provider is not your primary care provider (PCP), if your PCP is located in our provider database this encounter information will be shared with them immediately following your visit.  Consent: (Patient) Virginia Martin provided verbal consent for this virtual visit at the beginning of the encounter.  Current Medications:  Current Outpatient Medications:    azithromycin (ZITHROMAX) 250 MG tablet, Take 2 tablets on day 1, then 1 tablet daily on days 2 through 5, Disp: 6 tablet, Rfl: 0   amLODipine (NORVASC) 10 MG tablet, Take 1 tablet (10 mg total) by mouth daily., Disp: 90 tablet, Rfl: 1   ibuprofen (ADVIL,MOTRIN) 600 MG tablet, Take 1 tablet (600 mg total) by mouth every 6 (six) hours as needed., Disp: 30 tablet, Rfl: 0   levothyroxine (SYNTHROID) 50 MCG tablet, Take 1 tablet (50 mcg total) by mouth daily., Disp: 90 tablet, Rfl: 3   Semaglutide,0.25 or 0.'5MG'$ /DOS, (OZEMPIC, 0.25 OR 0.5 MG/DOSE,) 2 MG/3ML SOPN, Inject 0.5 mg into the muscle once a week., Disp: 3 mL, Rfl: 1   trimethoprim-polymyxin b (POLYTRIM) ophthalmic solution, Apply 1-2 drops into affected eye QID x 5 days., Disp: 10 mL, Rfl: 0   venlafaxine XR (EFFEXOR-XR) 150 MG 24 hr capsule, Take 1 capsule (150 mg total) by mouth daily., Disp: 90 capsule, Rfl: 3   Medications ordered in this encounter:  Meds ordered this encounter  Medications   azithromycin (ZITHROMAX) 250 MG tablet    Sig: Take 2 tablets on day 1, then 1 tablet daily on days 2 through 5    Dispense:  6 tablet    Refill:  0     *If you need refills on other medications prior to your next appointment, please contact your pharmacy*  Follow-Up: Call back or seek an in-person evaluation if the symptoms worsen or if the condition fails to improve as anticipated.  Other Instructions Use throat lozenges or gargle with salt water to help relieve your sore throat.  Continue  taking Advil for fever and pain as directed on the bottle.  Finish all the antibiotics even if you are feeling better.  Change your toothbrush in a couple of days so you do not get reinfection.   If you have been instructed to have an in-person evaluation today at a local Urgent Care facility, please use the link below. It will take you to a list of all of our available [facility], including address, phone number and hours of operation. Please do not delay care.  [facility]  If you or a family member do not have a primary care provider, use the link below to schedule a visit and establish care. When you choose a [facility] primary care physician or advanced practice provider, you gain a long-term partner in health. Find a Primary Care Provider  Learn more about [facility] in-office and virtual care options: [facility] Now
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
## 2021-09-19 ENCOUNTER — Encounter: Payer: Self-pay | Admitting: Family Medicine

## 2021-09-19 ENCOUNTER — Ambulatory Visit: Payer: 59 | Admitting: Family Medicine

## 2021-09-19 VITALS — BP 124/82 | HR 74 | Temp 98.9°F | Resp 16 | Ht 64.0 in | Wt 198.0 lb

## 2021-09-19 DIAGNOSIS — E1169 Type 2 diabetes mellitus with other specified complication: Secondary | ICD-10-CM

## 2021-09-19 DIAGNOSIS — F410 Panic disorder [episodic paroxysmal anxiety] without agoraphobia: Secondary | ICD-10-CM

## 2021-09-19 DIAGNOSIS — R7989 Other specified abnormal findings of blood chemistry: Secondary | ICD-10-CM

## 2021-09-19 DIAGNOSIS — E1159 Type 2 diabetes mellitus with other circulatory complications: Secondary | ICD-10-CM | POA: Diagnosis not present

## 2021-09-19 DIAGNOSIS — E785 Hyperlipidemia, unspecified: Secondary | ICD-10-CM

## 2021-09-19 DIAGNOSIS — E039 Hypothyroidism, unspecified: Secondary | ICD-10-CM | POA: Diagnosis not present

## 2021-09-19 DIAGNOSIS — E119 Type 2 diabetes mellitus without complications: Secondary | ICD-10-CM | POA: Insufficient documentation

## 2021-09-19 DIAGNOSIS — I152 Hypertension secondary to endocrine disorders: Secondary | ICD-10-CM

## 2021-09-19 LAB — POCT GLYCOSYLATED HEMOGLOBIN (HGB A1C)
Est. average glucose Bld gHb Est-mCnc: 131
Hemoglobin A1C: 6.2 % — AB (ref 4.0–5.6)

## 2021-09-19 MED ORDER — SEMAGLUTIDE (1 MG/DOSE) 4 MG/3ML ~~LOC~~ SOPN: 1.0000 mg | PEN_INJECTOR | SUBCUTANEOUS | 3 refills | Status: DC

## 2021-09-19 MED ORDER — VENLAFAXINE HCL ER 75 MG PO CP24: 75.0000 mg | ORAL_CAPSULE | Freq: Every day | ORAL | 1 refills | Status: DC

## 2021-09-19 NOTE — Assessment & Plan Note (Signed)
TSH elevated after last visit She is now taking synthroid regularly (dose was not changed) Recheck TSH and adjust Synthroid as indicated

## 2021-09-19 NOTE — Assessment & Plan Note (Signed)
Reviewed last lipid panel Not currently on a statin Recheck FLP and CMP at next visit Discussed diet and exercise

## 2021-09-19 NOTE — Patient Instructions (Signed)
Please call [facility] to schedule your annual routine mammogram [phone]  

## 2021-09-19 NOTE — Assessment & Plan Note (Signed)
New diagnosis after last visit with A1c 7.1 It is improving today with Ozempic with A1c down to 6.2 She is also trying to get weight loss benefit from Ozempic, so will increase dose to 1 mg weekly Discussed need for eye exam Foot exam and UACR today Follow-up in 3 months and repeat A1c

## 2021-09-19 NOTE — Assessment & Plan Note (Signed)
Well controlled Continue current medications Reviewed recent metabolic panel 
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
## 2021-09-20 LAB — HEPATIC FUNCTION PANEL
ALT: 46 IU/L — ABNORMAL HIGH (ref 0–32)
AST: 27 IU/L (ref 0–40)
Albumin: 4.3 g/dL (ref 3.8–4.9)
Alkaline Phosphatase: 121 IU/L (ref 44–121)
Bilirubin Total: 0.3 mg/dL (ref 0.0–1.2)
Bilirubin, Direct: <0.1 mg/dL (ref 0.00–0.40)
Total Protein: 7.6 g/dL (ref 6.0–8.5)

## 2021-09-20 LAB — MICROALBUMIN / CREATININE URINE RATIO
Creatinine, Urine: 219.7 mg/dL
Microalb/Creat Ratio: 9 mg/g creat (ref 0–29)
Microalbumin, Urine: 19.1 ug/mL

## 2021-09-20 LAB — TSH: TSH: 3.73 u[IU]/mL (ref 0.450–4.500)

## 2021-12-22 ENCOUNTER — Encounter: Payer: 59 | Admitting: Family Medicine

## 2021-12-25 ENCOUNTER — Encounter: Payer: 59 | Admitting: Family Medicine

## 2021-12-25 DIAGNOSIS — Z0000 Encounter for general adult medical examination without abnormal findings: Secondary | ICD-10-CM

## 2021-12-25 DIAGNOSIS — Z1231 Encounter for screening mammogram for malignant neoplasm of breast: Secondary | ICD-10-CM

## 2022-01-07 ENCOUNTER — Other Ambulatory Visit: Payer: Self-pay | Admitting: Family Medicine

## 2022-01-07 DIAGNOSIS — I10 Essential (primary) hypertension: Secondary | ICD-10-CM

## 2022-01-15 ENCOUNTER — Encounter: Payer: 59 | Admitting: Family Medicine

## 2022-02-13 ENCOUNTER — Other Ambulatory Visit: Payer: Self-pay | Admitting: Family Medicine

## 2022-02-13 NOTE — Telephone Encounter (Signed)
Requested Prescriptions  Pending Prescriptions Disp Refills   OZEMPIC, 1 MG/DOSE, 4 MG/3ML SOPN [Pharmacy Med Name: OZEMPIC 4 MG/3 ML (1 MG/DOSE)] 3 mL 2    Sig: INJECT 1 MG ONCE A WEEK AS DIRECTED     Endocrinology:  Diabetes - GLP-1 Receptor Agonists - semaglutide Failed - 02/13/2022  6:51 AM      Failed - HBA1C in normal range and within 180 days    Hemoglobin A1C  Date Value Ref Range Status  09/19/2021 6.2 (A) 4.0 - 5.6 % Final   Hgb A1c MFr Bld  Date Value Ref Range Status  06/15/2021 7.1 (H) 4.8 - 5.6 % Final    Comment:             Prediabetes: 5.7 - 6.4          Diabetes: >6.4          Glycemic control for adults with diabetes: <7.0          Passed - Cr in normal range and within 360 days    Creatinine, Ser  Date Value Ref Range Status  07/18/2021 0.97 0.57 - 1.00 mg/dL Final         Passed - Valid encounter within last 6 months    Recent Outpatient Visits           4 months ago Type 2 diabetes mellitus with other specified complication, without long-term current use of insulin [facility])   [facility] [place], Dionne Bucy, MD   8 months ago Primary hypertension   [facility] [place], Dionne Bucy, MD   1 year ago Primary hypertension   [place], Vickki Muff, PA-C   1 year ago Hypothyroidism, unspecified type   [place], [place], PA-C   1 year ago Depression, unspecified depression type   [place], [place], [place]

## 2022-02-18 ENCOUNTER — Telehealth: Payer: 59 | Admitting: Emergency Medicine

## 2022-02-18 DIAGNOSIS — J019 Acute sinusitis, unspecified: Secondary | ICD-10-CM

## 2022-02-18 DIAGNOSIS — B9689 Other specified bacterial agents as the cause of diseases classified elsewhere: Secondary | ICD-10-CM

## 2022-02-18 MED ORDER — PROMETHAZINE-DM 6.25-15 MG/5ML PO SYRP: 5.0000 mL | ORAL_SOLUTION | Freq: Four times a day (QID) | ORAL | 0 refills | Status: DC | PRN

## 2022-02-18 MED ORDER — DOXYCYCLINE HYCLATE 100 MG PO TABS: 100.0000 mg | ORAL_TABLET | Freq: Two times a day (BID) | ORAL | 0 refills | Status: AC

## 2022-02-18 NOTE — Patient Instructions (Signed)
Virginia Martin, thank you for joining Carvel Getting, NP for today's virtual visit.  While this provider is not your primary care provider (PCP), if your PCP is located in our provider database this encounter information will be shared with them immediately following your visit.   [facility] account gives you access to today's visit and all your visits, tests, and labs performed at [facility] " click here if you don't have a [facility] account or go to [email or web address]  Consent: (Patient) Virginia Martin provided verbal consent for this virtual visit at the beginning of the encounter.  Current Medications:  Current Outpatient Medications:    doxycycline (VIBRA-TABS) 100 MG tablet, Take 1 tablet (100 mg total) by mouth 2 (two) times daily for 10 days., Disp: 20 tablet, Rfl: 0   promethazine-dextromethorphan (PROMETHAZINE-DM) 6.25-15 MG/5ML syrup, Take 5 mLs by mouth 4 (four) times daily as needed for cough., Disp: 120 mL, Rfl: 0   amLODipine (NORVASC) 10 MG tablet, TAKE 1 TABLET BY MOUTH EVERY DAY, Disp: 90 tablet, Rfl: 0   ibuprofen (ADVIL,MOTRIN) 600 MG tablet, Take 1 tablet (600 mg total) by mouth every 6 (six) hours as needed., Disp: 30 tablet, Rfl: 0   levothyroxine (SYNTHROID) 50 MCG tablet, Take 1 tablet (50 mcg total) by mouth daily., Disp: 90 tablet, Rfl: 3   Semaglutide, 1 MG/DOSE, (OZEMPIC, 1 MG/DOSE,) 4 MG/3ML SOPN, INJECT 1 MG ONCE A WEEK AS DIRECTED, Disp: 3 mL, Rfl: 2   venlafaxine XR (EFFEXOR-XR) 150 MG 24 hr capsule, Take 1 capsule (150 mg total) by mouth daily., Disp: 90 capsule, Rfl: 3   venlafaxine XR (EFFEXOR-XR) 75 MG 24 hr capsule, Take 1 capsule (75 mg total) by mouth daily with breakfast. Take with the 150 dose to make '225mg'$  daily., Disp: 90 capsule, Rfl: 1   Medications ordered in this encounter:  Meds ordered this encounter  Medications   doxycycline (VIBRA-TABS) 100 MG tablet    Sig: Take 1 tablet (100 mg total) by mouth 2 (two)  times daily for 10 days.    Dispense:  20 tablet    Refill:  0   promethazine-dextromethorphan (PROMETHAZINE-DM) 6.25-15 MG/5ML syrup    Sig: Take 5 mLs by mouth 4 (four) times daily as needed for cough.    Dispense:  120 mL    Refill:  0     *If you need refills on other medications prior to your next appointment, please contact your pharmacy*  Follow-Up: Call back or seek an in-person evaluation if the symptoms worsen or if the condition fails to improve as anticipated.  [place] [phone]  Other Instructions I recommend taking Mucinex (or generic guaifenesin) and drinking lots of liquids, plus using saline nasal spray several times a day to help congestion thin and drain better.   Or try using saline irrigation, such as with a neti pot, several times a day while you are sick. Many neti pots come with salt packets premeasured to use to make saline. If you use your own salt, make sure it is kosher salt or sea salt (don't use table salt as it has iodine in it and you don't need that in your nose). Use distilled water to make saline. If you mix your own saline using your own salt, the recipe is 1/4 teaspoon salt in 1 cup warm water. Using saline irrigation can help prevent and treat sinus infections.    If you have been instructed to have  an in-person evaluation today at a local Urgent Care facility, please use the link below. It will take you to a list of all of our available [facility], including address, phone number and hours of operation. Please do not delay care.  [facility]  If you or a family member do not have a primary care provider, use the link below to schedule a visit and establish care. When you choose a [facility] primary care physician or advanced practice provider, you gain a long-term partner in health. Find a Primary Care Provider  Learn more about [facility] in-office and virtual care options: [facility]

## 2022-02-18 NOTE — Progress Notes (Signed)
Virtual Visit Consent   [name], you are scheduled for a virtual visit with a [facility] provider today. Just as with appointments in the office, your consent must be obtained to participate. Your consent will be active for this visit and any virtual visit you may have with one of our providers in the next 365 days. If you have a MyChart account, a copy of this consent can be sent to you electronically.  As this is a virtual visit, video technology does not allow for your provider to perform a traditional examination. This may limit your provider's ability to fully assess your condition. If your provider identifies any concerns that need to be evaluated in person or the need to arrange testing (such as labs, EKG, etc.), we will make arrangements to do so. Although advances in technology are sophisticated, we cannot ensure that it will always work on either your end or our end. If the connection with a video visit is poor, the visit may have to be switched to a telephone visit. With either a video or telephone visit, we are not always able to ensure that we have a secure connection.  By engaging in this virtual visit, you consent to the provision of healthcare and authorize for your insurance to be billed (if applicable) for the services provided during this visit. Depending on your insurance coverage, you may receive a charge related to this service.  I need to obtain your verbal consent now. Are you willing to proceed with your visit today? CALLIE FACEY has provided verbal consent on 02/18/2022 for a virtual visit (video or telephone). Carvel Getting, NP  Date: 02/18/2022 7:20 PM  Virtual Visit via Video Note   I, Carvel Getting, connected with  [name]  ([id number], [birth year]) on 02/18/22 at  7:15 PM EST by a video-enabled telemedicine application and verified that I am speaking with the correct person using two identifiers.  Location: Patient: Virtual Visit Location Patient:  Home Provider: Virtual Visit Location Provider: Home Office   I discussed the limitations of evaluation and management by telemedicine and the availability of in person appointments. The patient expressed understanding and agreed to proceed.    History of Present Illness: Virginia Martin is a 56 y.o. who identifies as a female who was assigned female at birth, and is being seen today for productive cough. Has been sick for 2-3 weeks with a cold. Now has sinus pressure in B maxillary sinuses, post nasal drainage, cough. No fever or chills. Nasal drainage and sputum are the same - yellow/green thick mucus. Has been using Coricidin and Advil sinus medication with little relief. Denies SOB or wheezing. Feels like infection is not in lungs but in her head.   HPI: HPI  Problems:  Patient Active Problem List   Diagnosis Date Noted   Diabetes mellitus ([place]) 09/19/2021   Hyperlipidemia associated with type 2 diabetes mellitus ([place]) 06/15/2021   Morbid obesity ([place]) 06/15/2021   Allergic rhinitis with postnasal drip 06/15/2021   Hypertension associated with diabetes ([place]) 05/05/2020   Hypothyroidism 05/05/2020   Depression 02/16/2015   Panic attacks 02/16/2015    Allergies:  Allergies  Allergen Reactions   Clindamycin Hcl Diarrhea    Other Reaction: GI trouble-hospitalized   Codeine Rash   Amoxicillin Itching    Other reaction(s): Other (See Comments) Other Reaction: blisters   Diphenhydramine Hcl Other (See Comments)    Other Reaction: racing heart   Gabapentin Rash  Other Reaction: red skin   Medications:  Current Outpatient Medications:    doxycycline (VIBRA-TABS) 100 MG tablet, Take 1 tablet (100 mg total) by mouth 2 (two) times daily for 10 days., Disp: 20 tablet, Rfl: 0   promethazine-dextromethorphan (PROMETHAZINE-DM) 6.25-15 MG/5ML syrup, Take 5 mLs by mouth 4 (four) times daily as needed for cough., Disp: 120 mL, Rfl: 0   amLODipine (NORVASC) 10 MG tablet, TAKE 1 TABLET BY MOUTH  EVERY DAY, Disp: 90 tablet, Rfl: 0   ibuprofen (ADVIL,MOTRIN) 600 MG tablet, Take 1 tablet (600 mg total) by mouth every 6 (six) hours as needed., Disp: 30 tablet, Rfl: 0   levothyroxine (SYNTHROID) 50 MCG tablet, Take 1 tablet (50 mcg total) by mouth daily., Disp: 90 tablet, Rfl: 3   Semaglutide, 1 MG/DOSE, (OZEMPIC, 1 MG/DOSE,) 4 MG/3ML SOPN, INJECT 1 MG ONCE A WEEK AS DIRECTED, Disp: 3 mL, Rfl: 2   venlafaxine XR (EFFEXOR-XR) 150 MG 24 hr capsule, Take 1 capsule (150 mg total) by mouth daily., Disp: 90 capsule, Rfl: 3   venlafaxine XR (EFFEXOR-XR) 75 MG 24 hr capsule, Take 1 capsule (75 mg total) by mouth daily with breakfast. Take with the 150 dose to make '225mg'$  daily., Disp: 90 capsule, Rfl: 1  Observations/Objective: Patient is well-developed, well-nourished in no acute distress.  Resting comfortably  at home.  Head is normocephalic, atraumatic.  No labored breathing.  Speech is clear and coherent with logical content.  Patient is alert and oriented at baseline.    Assessment and Plan: 1. Acute bacterial sinusitis  Rx doxycycline due to amoxicillin allergy. REcommmended suppportive care measures.   Follow Up Instructions: I discussed the assessment and treatment plan with the patient. The patient was provided an opportunity to ask questions and all were answered. The patient agreed with the plan and demonstrated an understanding of the instructions.  A copy of instructions were sent to the patient via MyChart unless otherwise noted below.   The patient was advised to call back or seek an in-person evaluation if the symptoms worsen or if the condition fails to improve as anticipated.  Time:  I spent 10 minutes with the patient via telehealth technology discussing the above problems/concerns.    Carvel Getting, NP

## 2022-03-14 ENCOUNTER — Other Ambulatory Visit: Payer: Self-pay | Admitting: Family Medicine

## 2022-03-14 NOTE — Telephone Encounter (Signed)
Requested Prescriptions  Pending Prescriptions Disp Refills   venlafaxine XR (EFFEXOR-XR) 75 MG 24 hr capsule [Pharmacy Med Name: VENLAFAXINE HCL ER 75 MG CAP] 90 capsule 0    Sig: TAKE 1 CAPSULE BY MOUTH DAILY WITH BREAKFAST. TAKE WITH THE 150 DOSE TO MAKE '225MG'$  DAILY.     Psychiatry: Antidepressants - SNRI - desvenlafaxine & venlafaxine Failed - 03/14/2022  1:41 AM      Failed - Lipid Panel in normal range within the last 12 months    Cholesterol, Total  Date Value Ref Range Status  06/15/2021 227 (H) 100 - 199 mg/dL Final   LDL Chol Calc (NIH)  Date Value Ref Range Status  06/15/2021 137 (H) 0 - 99 mg/dL Final   HDL  Date Value Ref Range Status  06/15/2021 47 >39 mg/dL Final   Triglycerides  Date Value Ref Range Status  06/15/2021 241 (H) 0 - 149 mg/dL Final         Passed - Cr in normal range and within 360 days    Creatinine, Ser  Date Value Ref Range Status  07/18/2021 0.97 0.57 - 1.00 mg/dL Final         Passed - Completed PHQ-2 or PHQ-9 in the last 360 days      Passed - Last BP in normal range    BP Readings from Last 1 Encounters:  09/19/21 124/82         Passed - Valid encounter within last 6 months    Recent Outpatient Visits           5 months ago Type 2 diabetes mellitus with other specified complication, without long-term current use of insulin [facility])   [facility] [place], Dionne Bucy, MD   9 months ago Primary hypertension   [facility] [place], Dionne Bucy, MD   1 year ago Primary hypertension   [place], Vickki Muff, PA-C   1 year ago Hypothyroidism, unspecified type   [place], [place], [place]   2 years ago Depression, unspecified depression type   [place], [place], [place]

## 2022-04-11 ENCOUNTER — Other Ambulatory Visit: Payer: Self-pay | Admitting: Family Medicine

## 2022-04-11 DIAGNOSIS — I10 Essential (primary) hypertension: Secondary | ICD-10-CM

## 2022-04-11 NOTE — Telephone Encounter (Signed)
Patient called and advised the need for a f/u visit, she verbalized understanding, scheduled for 04/20/22.

## 2022-04-11 NOTE — Telephone Encounter (Signed)
Requested Prescriptions  Pending Prescriptions Disp Refills   amLODipine (NORVASC) 10 MG tablet [Pharmacy Med Name: AMLODIPINE BESYLATE 10 MG TAB] 90 tablet 0    Sig: TAKE 1 TABLET BY MOUTH EVERY DAY     Cardiovascular: Calcium Channel Blockers 2 Failed - 04/11/2022  1:29 AM      Failed - Valid encounter within last 6 months    Recent Outpatient Visits           6 months ago Type 2 diabetes mellitus with other specified complication, without long-term current use of insulin [facility])   [facility] [place], Dionne Bucy, MD   10 months ago Primary hypertension   [facility] [employer], Dionne Bucy, MD   1 year ago Primary hypertension   [place], Vickki Muff, PA-C   1 year ago Hypothyroidism, unspecified type   [facility] [place], [place], [place]   2 years ago Depression, unspecified depression type   [place], Wendee Beavers, [place]       Future Appointments             In 1 week Bacigalupo, Dionne Bucy, MD [facility], [place] BP in normal range    BP Readings from Last 1 Encounters:  09/19/21 124/82         Passed - Last Heart Rate in normal range    Pulse Readings from Last 1 Encounters:  09/19/21 74

## 2022-04-20 ENCOUNTER — Ambulatory Visit: Payer: 59 | Admitting: Family Medicine

## 2022-04-20 ENCOUNTER — Encounter: Payer: Self-pay | Admitting: Family Medicine

## 2022-04-20 VITALS — BP 131/66 | HR 77 | Temp 98.2°F | Resp 16 | Wt 191.4 lb

## 2022-04-20 DIAGNOSIS — F3342 Major depressive disorder, recurrent, in full remission: Secondary | ICD-10-CM

## 2022-04-20 DIAGNOSIS — E1169 Type 2 diabetes mellitus with other specified complication: Secondary | ICD-10-CM

## 2022-04-20 DIAGNOSIS — M5412 Radiculopathy, cervical region: Secondary | ICD-10-CM

## 2022-04-20 DIAGNOSIS — E669 Obesity, unspecified: Secondary | ICD-10-CM | POA: Diagnosis not present

## 2022-04-20 DIAGNOSIS — E039 Hypothyroidism, unspecified: Secondary | ICD-10-CM | POA: Diagnosis not present

## 2022-04-20 DIAGNOSIS — Z6832 Body mass index (BMI) 32.0-32.9, adult: Secondary | ICD-10-CM

## 2022-04-20 DIAGNOSIS — E1159 Type 2 diabetes mellitus with other circulatory complications: Secondary | ICD-10-CM

## 2022-04-20 DIAGNOSIS — I152 Hypertension secondary to endocrine disorders: Secondary | ICD-10-CM

## 2022-04-20 DIAGNOSIS — E785 Hyperlipidemia, unspecified: Secondary | ICD-10-CM

## 2022-04-20 MED ORDER — PREDNISONE 10 MG PO TABS: ORAL_TABLET | ORAL | 0 refills | Status: DC

## 2022-04-20 NOTE — Assessment & Plan Note (Signed)
Reviewed last lipid panel Not currently on a statin - have discussed importance - patient declines Recheck FLP and CMP Discussed diet and exercise

## 2022-04-20 NOTE — Assessment & Plan Note (Signed)
Well controlled with last A1c - recheck today Continue Ozempic UTD on vaccines, foot exam, UACR Advised of need for eye exam Discussed diet and exercise F/u in 6 months

## 2022-04-20 NOTE — Assessment & Plan Note (Signed)
Well controlled on effexor - continue current dose

## 2022-04-20 NOTE — Assessment & Plan Note (Signed)
Discussed importance of healthy weight management Discussed diet and exercise  

## 2022-04-20 NOTE — Assessment & Plan Note (Signed)
Well controlled Continue current medications Recheck metabolic panel F/u in 6 months  

## 2022-04-20 NOTE — Progress Notes (Signed)
I,Sulibeya S Dimas,acting as a Education administrator for Lavon Paganini, MD.,have documented all relevant documentation on the behalf of Lavon Paganini, MD,as directed by  Lavon Paganini, MD while in the presence of Lavon Paganini, MD.     Established patient visit   Patient: Virginia Martin   DOB: October 09, 1965   57 y.o. Female  MRN: [id number] Visit Date: 04/20/2022  Today's healthcare provider: Lavon Paganini, MD   Chief Complaint  Patient presents with   Diabetes   Hypertension   Hyperlipidemia   Depression   Subjective    HPI  Diabetes Mellitus Type II, follow-up  Lab Results  Component Value Date   HGBA1C 6.2 (A) 09/19/2021   HGBA1C 7.1 (H) 06/15/2021   HGBA1C 5.9 (H) 04/12/2017   Last seen for diabetes 6 months ago.  Management since then includes increase Ozempic to '1mg'$  weekly.  She reports excellent compliance with treatment.  Has intermittent  She is not having side effects.  Home blood sugar records: fasting range: 120-130s  Episodes of hypoglycemia? No    Current insulin regiment: none Most Recent Eye Exam: will schedule  --------------------------------------------------------------------------------------------------- Hypertension, follow-up  BP Readings from Last 3 Encounters:  04/20/22 131/66  09/19/21 124/82  06/15/21 132/89   Wt Readings from Last 3 Encounters:  04/20/22 191 lb 6.4 oz (86.8 kg)  09/19/21 198 lb (89.8 kg)  06/15/21 209 lb (94.8 kg)     She was last seen for hypertension 6 months ago.  BP at that visit was 124/82. Management since that visit includes no changes. She reports excellent compliance with treatment.  Outside blood pressures are not being checked. --------------------------------------------------------------------------------------------------- Lipid/Cholesterol, follow-up  Last Lipid Panel: Lab Results  Component Value Date   CHOL 227 (H) 06/15/2021   LDLCALC 137 (H) 06/15/2021   HDL 47 06/15/2021   TRIG  241 (H) 06/15/2021    She was last seen for this 6 months ago.  Management since that visit includes no changes.  She reports excellent compliance with treatment. She is not having side effects.   Last metabolic panel Lab Results  Component Value Date   GLUCOSE 128 (H) 07/18/2021   NA 140 07/18/2021   K 4.5 07/18/2021   BUN 17 07/18/2021   CREATININE 0.97 07/18/2021   EGFR 69 07/18/2021   GFRNONAA 77 03/11/2020   CALCIUM 9.8 07/18/2021   AST 27 09/19/2021   ALT 46 (H) 09/19/2021   The 10-year ASCVD risk score (Arnett DK, et al., 2019) is: 7.1%  --------------------------------------------------------------------------------------------------- Depression, Follow-up  She  was last seen for this 6 months ago. Changes made at last visit include no changes.   She reports excellent compliance with treatment. She is not having side effects.      04/20/2022    8:59 AM 09/19/2021    8:13 AM 06/15/2021    8:41 AM  Depression screen PHQ 2/9  Decreased Interest 0 0 0  Down, Depressed, Hopeless 0 0 0  PHQ - 2 Score 0 0 0  Altered sleeping '1 2 3  '$ Tired, decreased energy '1 1 3  '$ Change in appetite 0 0 0  Feeling bad or failure about yourself  0 0 0  Trouble concentrating 0 0 0  Moving slowly or fidgety/restless 0 0 0  Suicidal thoughts 0 0 0  PHQ-9 Score '2 3 6  '$ Difficult doing work/chores Not difficult at all Not difficult at all Not difficult at all    -----------------------------------------------------------------------------------------  Pain in upper back and L arm/shoulder. Pain shooting  from shoulder to elbow that takes her breath. Tingling in fingers of L hand. Feels a burning pain.  No neck pain. No weakness or decreased ROM. No help with tylenol or advil - only takes when it is really bad.   Medications: Outpatient Medications Prior to Visit  Medication Sig   amLODipine (NORVASC) 10 MG tablet TAKE 1 TABLET BY MOUTH EVERY DAY   ibuprofen (ADVIL,MOTRIN) 600 MG  tablet Take 1 tablet (600 mg total) by mouth every 6 (six) hours as needed.   levothyroxine (SYNTHROID) 50 MCG tablet Take 1 tablet (50 mcg total) by mouth daily.   Semaglutide, 1 MG/DOSE, (OZEMPIC, 1 MG/DOSE,) 4 MG/3ML SOPN INJECT 1 MG ONCE A WEEK AS DIRECTED   venlafaxine XR (EFFEXOR-XR) 150 MG 24 hr capsule Take 1 capsule (150 mg total) by mouth daily.   venlafaxine XR (EFFEXOR-XR) 75 MG 24 hr capsule TAKE 1 CAPSULE BY MOUTH DAILY WITH BREAKFAST. TAKE WITH THE 150 DOSE TO MAKE '225MG'$  DAILY.   [DISCONTINUED] promethazine-dextromethorphan (PROMETHAZINE-DM) 6.25-15 MG/5ML syrup Take 5 mLs by mouth 4 (four) times daily as needed for cough. (Patient not taking: Reported on 04/20/2022)   No facility-administered medications prior to visit.    Review of Systems  Constitutional:  Negative for appetite change and fatigue.  Eyes:  Negative for visual disturbance.  Respiratory:  Negative for chest tightness and shortness of breath.   Cardiovascular:  Negative for chest pain and leg swelling.  Gastrointestinal:  Negative for abdominal pain, nausea and vomiting.  Neurological:  Negative for dizziness, light-headedness and headaches.       Objective    BP 131/66 (BP Location: Right Arm, Patient Position: Sitting, Cuff Size: Large)   Pulse 77   Temp 98.2 F (36.8 C) (Temporal)   Resp 16   Wt 191 lb 6.4 oz (86.8 kg)   SpO2 97%   BMI 32.85 kg/m  BP Readings from Last 3 Encounters:  04/20/22 131/66  09/19/21 124/82  06/15/21 132/89   Wt Readings from Last 3 Encounters:  04/20/22 191 lb 6.4 oz (86.8 kg)  09/19/21 198 lb (89.8 kg)  06/15/21 209 lb (94.8 kg)      Physical Exam Vitals reviewed.  Constitutional:      General: She is not in acute distress.    Appearance: Normal appearance. She is well-developed. She is not diaphoretic.  HENT:     Head: Normocephalic and atraumatic.  Eyes:     General: No scleral icterus.    Conjunctiva/sclera: Conjunctivae normal.  Neck:     Thyroid:  No thyromegaly.  Cardiovascular:     Rate and Rhythm: Normal rate and regular rhythm.     Pulses: Normal pulses.     Heart sounds: Normal heart sounds. No murmur heard. Pulmonary:     Effort: Pulmonary effort is normal. No respiratory distress.     Breath sounds: Normal breath sounds. No wheezing, rhonchi or rales.  Musculoskeletal:     Cervical back: Neck supple.     Right lower leg: No edema.     Left lower leg: No edema.  Lymphadenopathy:     Cervical: No cervical adenopathy.  Skin:    General: Skin is warm and dry.     Findings: No rash.  Neurological:     Mental Status: She is alert and oriented to person, place, and time. Mental status is at baseline.  Psychiatric:        Mood and Affect: Mood normal.        Behavior:  Behavior normal.       No results found for any visits on 04/20/22.  Assessment & Plan     Problem List Items Addressed This Visit       Cardiovascular and Mediastinum   Hypertension associated with diabetes ([place]) - Primary    Well controlled Continue current medications Recheck metabolic panel F/u in 6 months         Endocrine   Hypothyroidism    Previously well controlled Continue Synthroid at current dose  Recheck TSH and adjust Synthroid as indicated        Hyperlipidemia associated with type 2 diabetes mellitus ([place])    Reviewed last lipid panel Not currently on a statin - have discussed importance - patient declines Recheck FLP and CMP Discussed diet and exercise       Diabetes mellitus ([place])    Well controlled with last A1c - recheck today Continue Ozempic UTD on vaccines, foot exam, UACR Advised of need for eye exam Discussed diet and exercise F/u in 6 months         Other   Depression    Well controlled on effexor - continue current dose      Obesity    Discussed importance of healthy weight management Discussed diet and exercise         Return in about 6 months (around 10/19/2022) for CPE.      I, Lavon Paganini, MD, have reviewed all documentation for this visit. The documentation on 04/20/22 for the exam, diagnosis, procedures, and orders are all accurate and complete.   Kaileigh Viswanathan, Dionne Bucy, MD, MPH [facility]

## 2022-04-20 NOTE — Assessment & Plan Note (Signed)
Previously well controlled Continue Synthroid at current dose  Recheck TSH and adjust Synthroid as indicated   

## 2022-04-21 LAB — COMPREHENSIVE METABOLIC PANEL
ALT: 33 IU/L — ABNORMAL HIGH (ref 0–32)
AST: 20 IU/L (ref 0–40)
Albumin/Globulin Ratio: 1.4 (ref 1.2–2.2)
Albumin: 4.5 g/dL (ref 3.8–4.9)
Alkaline Phosphatase: 138 IU/L — ABNORMAL HIGH (ref 44–121)
BUN/Creatinine Ratio: 20 (ref 9–23)
BUN: 18 mg/dL (ref 6–24)
Bilirubin Total: 0.4 mg/dL (ref 0.0–1.2)
CO2: 25 mmol/L (ref 20–29)
Calcium: 10.1 mg/dL (ref 8.7–10.2)
Chloride: 99 mmol/L (ref 96–106)
Creatinine, Ser: 0.88 mg/dL (ref 0.57–1.00)
Globulin, Total: 3.2 g/dL (ref 1.5–4.5)
Glucose: 128 mg/dL — ABNORMAL HIGH (ref 70–99)
Potassium: 4.7 mmol/L (ref 3.5–5.2)
Sodium: 140 mmol/L (ref 134–144)
Total Protein: 7.7 g/dL (ref 6.0–8.5)
eGFR: 78 mL/min/{1.73_m2} (ref >59)

## 2022-04-21 LAB — LIPID PANEL
Chol/HDL Ratio: 4.5 ratio — ABNORMAL HIGH (ref 0.0–4.4)
Cholesterol, Total: 230 mg/dL — ABNORMAL HIGH (ref 100–199)
HDL: 51 mg/dL (ref >39)
LDL Chol Calc (NIH): 136 mg/dL — ABNORMAL HIGH (ref 0–99)
Triglycerides: 238 mg/dL — ABNORMAL HIGH (ref 0–149)
VLDL Cholesterol Cal: 43 mg/dL — ABNORMAL HIGH (ref 5–40)

## 2022-04-21 LAB — TSH: TSH: 4.33 u[IU]/mL (ref 0.450–4.500)

## 2022-04-21 LAB — HEMOGLOBIN A1C
Est. average glucose Bld gHb Est-mCnc: 131 mg/dL
Hgb A1c MFr Bld: 6.2 % — ABNORMAL HIGH (ref 4.8–5.6)

## 2022-04-23 ENCOUNTER — Other Ambulatory Visit: Payer: Self-pay

## 2022-04-23 MED ORDER — ROSUVASTATIN CALCIUM 5 MG PO TABS: 5.0000 mg | ORAL_TABLET | Freq: Every day | ORAL | 1 refills | Status: DC

## 2022-05-28 ENCOUNTER — Telehealth: Payer: Self-pay | Admitting: Family Medicine

## 2022-05-28 NOTE — Telephone Encounter (Signed)
PA started today.

## 2022-05-28 NOTE — Telephone Encounter (Signed)
Received a fax from covermymeds for Ozempic  Key: [id number]  Patients name on on insurance is Virginia Martin

## 2022-05-29 ENCOUNTER — Other Ambulatory Visit: Payer: Self-pay | Admitting: Family Medicine

## 2022-05-29 NOTE — Telephone Encounter (Signed)
Insurance Approved PA for ozempic.

## 2022-06-08 LAB — HM DIABETES EYE EXAM

## 2022-06-13 ENCOUNTER — Other Ambulatory Visit: Payer: Self-pay | Admitting: Family Medicine

## 2022-06-13 DIAGNOSIS — E039 Hypothyroidism, unspecified: Secondary | ICD-10-CM

## 2022-07-11 ENCOUNTER — Other Ambulatory Visit: Payer: Self-pay | Admitting: Family Medicine

## 2022-07-11 DIAGNOSIS — I10 Essential (primary) hypertension: Secondary | ICD-10-CM

## 2022-07-11 NOTE — Telephone Encounter (Signed)
Requested Prescriptions  Pending Prescriptions Disp Refills   amLODipine (NORVASC) 10 MG tablet [Pharmacy Med Name: AMLODIPINE BESYLATE 10 MG TAB] 90 tablet 0    Sig: TAKE 1 TABLET BY MOUTH EVERY DAY     Cardiovascular: Calcium Channel Blockers 2 Passed - 07/11/2022  2:22 AM      Passed - Last BP in normal range    BP Readings from Last 1 Encounters:  04/20/22 131/66         Passed - Last Heart Rate in normal range    Pulse Readings from Last 1 Encounters:  04/20/22 77         Passed - Valid encounter within last 6 months    Recent Outpatient Visits           2 months ago Hypertension associated with diabetes [facility])   [place], Dionne Bucy, MD   9 months ago Type 2 diabetes mellitus with other specified complication, without long-term current use of insulin [facility])   [place], Dionne Bucy, MD   1 year ago Primary hypertension   [place], Dionne Bucy, MD   1 year ago Primary hypertension   [place], Vickki Muff, [place]   2 years ago Hypothyroidism, unspecified type   [place], Wendee Beavers, [place]       Future Appointments             In 3 months Bacigalupo, Dionne Bucy, MD [facility]

## 2022-07-26 ENCOUNTER — Other Ambulatory Visit: Payer: Self-pay | Admitting: Family Medicine

## 2022-07-26 DIAGNOSIS — F419 Anxiety disorder, unspecified: Secondary | ICD-10-CM

## 2022-09-07 ENCOUNTER — Other Ambulatory Visit: Payer: Self-pay | Admitting: Family Medicine

## 2022-09-07 DIAGNOSIS — E039 Hypothyroidism, unspecified: Secondary | ICD-10-CM

## 2022-09-07 NOTE — Telephone Encounter (Signed)
Requested Prescriptions  Pending Prescriptions Disp Refills   venlafaxine XR (EFFEXOR-XR) 75 MG 24 hr capsule [Pharmacy Med Name: VENLAFAXINE HCL ER 75 MG CAP] 90 capsule 0    Sig: TAKE 1 CAPSULE BY MOUTH DAILY WITH BREAKFAST. TAKE WITH THE 150 DOSE TO MAKE 225MG  DAILY.     Psychiatry: Antidepressants - SNRI - desvenlafaxine & venlafaxine Failed - 09/07/2022  2:38 AM      Failed - Lipid Panel in normal range within the last 12 months    Cholesterol, Total  Date Value Ref Range Status  04/20/2022 230 (H) 100 - 199 mg/dL Final   LDL Chol Calc (NIH)  Date Value Ref Range Status  04/20/2022 136 (H) 0 - 99 mg/dL Final   HDL  Date Value Ref Range Status  04/20/2022 51 >39 mg/dL Final   Triglycerides  Date Value Ref Range Status  04/20/2022 238 (H) 0 - 149 mg/dL Final         Passed - Cr in normal range and within 360 days    Creatinine, Ser  Date Value Ref Range Status  04/20/2022 0.88 0.57 - 1.00 mg/dL Final         Passed - Completed PHQ-2 or PHQ-9 in the last 360 days      Passed - Last BP in normal range    BP Readings from Last 1 Encounters:  04/20/22 131/66         Passed - Valid encounter within last 6 months    Recent Outpatient Visits           4 months ago Hypertension associated with diabetes [facility])   [place] [facility] [place], Marzella Schlein, MD   11 months ago Type 2 diabetes mellitus with other specified complication, without long-term current use of insulin [facility])   [place] [facility] [place], Marzella Schlein, MD   1 year ago Primary hypertension   [facility] [place], Marzella Schlein, MD   1 year ago Primary hypertension   [place] [facility] [place], Jodell Cipro, PA-C   2 years ago Hypothyroidism, unspecified type   [facility] [place], Lavella Hammock, [place]       Future Appointments             In 1 month Bacigalupo, Marzella Schlein, MD [facility]             levothyroxine (SYNTHROID) 50 MCG tablet [Pharmacy Med Name: LEVOTHYROXINE 50 MCG TABLET] 90 tablet 2    Sig: TAKE 1 TABLET BY MOUTH EVERY DAY     Endocrinology:  Hypothyroid Agents Passed - 09/07/2022  2:38 AM      Passed - TSH in normal range and within 360 days    TSH  Date Value Ref Range Status  04/20/2022 4.330 0.450 - 4.500 uIU/mL Final         Passed - Valid encounter within last 12 months    Recent Outpatient Visits           4 months ago Hypertension associated with diabetes [facility])   [place] [facility] [place], Marzella Schlein, MD   11 months ago Type 2 diabetes mellitus with other specified complication, without long-term current use of insulin [facility])   [place] [facility] [place], Marzella Schlein, MD   1 year ago Primary hypertension   [place] [place], Marzella Schlein, MD   1 year ago  Primary hypertension   [place] [facility] [place], Jodell Cipro, [place]   2 years ago Hypothyroidism, unspecified type   [facility] [place], Lavella [place], [place]       Future Appointments             In 1 month Bacigalupo, Marzella Schlein, MD [facility], [facility]

## 2022-10-09 ENCOUNTER — Other Ambulatory Visit: Payer: Self-pay | Admitting: Family Medicine

## 2022-10-09 DIAGNOSIS — I10 Essential (primary) hypertension: Secondary | ICD-10-CM

## 2022-10-15 ENCOUNTER — Other Ambulatory Visit: Payer: Self-pay | Admitting: Family Medicine

## 2022-10-29 ENCOUNTER — Other Ambulatory Visit: Payer: Self-pay | Admitting: Family Medicine

## 2022-10-29 DIAGNOSIS — F419 Anxiety disorder, unspecified: Secondary | ICD-10-CM

## 2022-11-02 ENCOUNTER — Ambulatory Visit: Payer: 59 | Admitting: Family Medicine

## 2022-11-02 ENCOUNTER — Encounter: Payer: Self-pay | Admitting: Family Medicine

## 2022-11-02 VITALS — BP 130/75 | HR 72 | Temp 97.8°F | Resp 12 | Ht 64.0 in | Wt 198.8 lb

## 2022-11-02 DIAGNOSIS — E669 Obesity, unspecified: Secondary | ICD-10-CM

## 2022-11-02 DIAGNOSIS — Z6832 Body mass index (BMI) 32.0-32.9, adult: Secondary | ICD-10-CM | POA: Diagnosis not present

## 2022-11-02 DIAGNOSIS — Z1231 Encounter for screening mammogram for malignant neoplasm of breast: Secondary | ICD-10-CM

## 2022-11-02 DIAGNOSIS — E1159 Type 2 diabetes mellitus with other circulatory complications: Secondary | ICD-10-CM

## 2022-11-02 DIAGNOSIS — E039 Hypothyroidism, unspecified: Secondary | ICD-10-CM

## 2022-11-02 DIAGNOSIS — Z0000 Encounter for general adult medical examination without abnormal findings: Secondary | ICD-10-CM | POA: Diagnosis not present

## 2022-11-02 DIAGNOSIS — E785 Hyperlipidemia, unspecified: Secondary | ICD-10-CM

## 2022-11-02 DIAGNOSIS — J309 Allergic rhinitis, unspecified: Secondary | ICD-10-CM

## 2022-11-02 DIAGNOSIS — F3342 Major depressive disorder, recurrent, in full remission: Secondary | ICD-10-CM

## 2022-11-02 DIAGNOSIS — I152 Hypertension secondary to endocrine disorders: Secondary | ICD-10-CM

## 2022-11-02 DIAGNOSIS — E1169 Type 2 diabetes mellitus with other specified complication: Secondary | ICD-10-CM | POA: Diagnosis not present

## 2022-11-02 DIAGNOSIS — G4733 Obstructive sleep apnea (adult) (pediatric): Secondary | ICD-10-CM | POA: Insufficient documentation

## 2022-11-02 NOTE — Assessment & Plan Note (Signed)
Managed with Amlodipine 10mg  daily. -Continue current medication. -Blood pressure well-controlled at current visit.

## 2022-11-02 NOTE — Assessment & Plan Note (Signed)
Managed with Synthroid daily. -Continue current medication. -Order thyroid function tests.

## 2022-11-02 NOTE — Assessment & Plan Note (Signed)
Managed with Effexor 225mg  daily XR. -Continue current medication.

## 2022-11-02 NOTE — Progress Notes (Signed)
Complete physical exam  Patient: Virginia Martin   DOB: [birth year]   57 y.o. Female  MRN: [id number]  Subjective:    Chief Complaint  Patient presents with   Annual Exam    Virginia Martin is a 57 y.o. female who presents today for a complete physical exam. She reports consuming a general diet.  She generally feels well. She reports sleeping poorly. She does not have additional problems to discuss today.   Discussed the use of AI scribe software for clinical note transcription with the patient, who gave verbal consent to proceed.  History of Present Illness   A 57 year old patient with a history of type 2 diabetes, hyperlipidemia, hypertension, hypothyroidism, obesity, and depression presents for a physical. The patient is currently on Synthroid for hypothyroidism, Amlodipine for hypertension, Crestor for hyperlipidemia, Ozempic for diabetes, and Effexor for depression. The patient reports constant drainage in the back of her throat, especially when trying to sleep. She denies using any nose spray or allergy medicine. The patient also reports difficulty staying asleep and feeling tired all the time. She can doze off easily but does not stay asleep. The patient has a history of sleep apnea and was previously on a CPAP machine but couldn't tolerate it due to claustrophobia.        Most recent fall risk assessment:    11/02/2022    8:55 AM  Fall Risk   Falls in the past year? 0  Number falls in past yr: 0  Injury with Fall? 0  Risk for fall due to : No Fall Risks  Follow up Falls evaluation completed     Most recent depression screenings:    11/02/2022    8:55 AM 04/20/2022    8:59 AM  PHQ 2/9 Scores  PHQ - 2 Score 2 0  PHQ- 9 Score 8 2        Patient Care Team: Erasmo Downer, MD as PCP - General (Family Medicine)   Outpatient Medications Prior to Visit  Medication Sig   amLODipine (NORVASC) 10 MG tablet TAKE 1 TABLET BY MOUTH EVERY DAY   ibuprofen (ADVIL,MOTRIN) 600  MG tablet Take 1 tablet (600 mg total) by mouth every 6 (six) hours as needed.   levothyroxine (SYNTHROID) 50 MCG tablet TAKE 1 TABLET BY MOUTH EVERY DAY   rosuvastatin (CRESTOR) 5 MG tablet TAKE 1 TABLET (5 MG TOTAL) BY MOUTH DAILY.   Semaglutide, 1 MG/DOSE, (OZEMPIC, 1 MG/DOSE,) 4 MG/3ML SOPN INJECT 1 MG ONCE A WEEK AS DIRECTED   venlafaxine XR (EFFEXOR-XR) 150 MG 24 hr capsule TAKE 1 CAPSULE BY MOUTH EVERY DAY   venlafaxine XR (EFFEXOR-XR) 75 MG 24 hr capsule TAKE 1 CAPSULE BY MOUTH DAILY WITH BREAKFAST. TAKE WITH THE 150 DOSE TO MAKE 225MG  DAILY.   [DISCONTINUED] predniSONE (DELTASONE) 10 MG tablet Take 60mg  PO daily x1d, then 50mg  daily x1d, then 40mg  daily x1d, then 30mg  daily x1d, then 20mg  daily x1d, then 10mg  daily x1d, then stop (Patient not taking: Reported on 11/02/2022)   No facility-administered medications prior to visit.    ROS per HPI    Objective:     BP 130/75 (BP Location: Left Arm, Patient Position: Sitting, Cuff Size: Large)   Pulse 72   Temp 97.8 F (36.6 C) (Temporal)   Resp 12   Ht 5\' 4"  (1.626 m)   Wt 198 lb 12.8 oz (90.2 kg)   SpO2 97%   BMI 34.12 kg/m    Physical Exam Vitals  reviewed.  Constitutional:      General: She is not in acute distress.    Appearance: Normal appearance. She is well-developed. She is not diaphoretic.  HENT:     Head: Normocephalic and atraumatic.     Right Ear: Tympanic membrane, ear canal and external ear normal.     Left Ear: Tympanic membrane, ear canal and external ear normal.     Nose: Nose normal.     Mouth/Throat:     Mouth: Mucous membranes are moist.     Pharynx: Oropharynx is clear. No oropharyngeal exudate.  Eyes:     General: No scleral icterus.    Conjunctiva/sclera: Conjunctivae normal.     Pupils: Pupils are equal, round, and reactive to light.  Neck:     Thyroid: No thyromegaly.  Cardiovascular:     Rate and Rhythm: Normal rate and regular rhythm.     Heart sounds: Normal heart sounds. No murmur  heard. Pulmonary:     Effort: Pulmonary effort is normal. No respiratory distress.     Breath sounds: Normal breath sounds. No wheezing or rales.  Abdominal:     General: There is no distension.     Palpations: Abdomen is soft.     Tenderness: There is no abdominal tenderness.  Musculoskeletal:        General: No deformity.     Cervical back: Neck supple.     Right lower leg: No edema.     Left lower leg: No edema.  Lymphadenopathy:     Cervical: No cervical adenopathy.  Skin:    General: Skin is warm and dry.     Findings: No rash.  Neurological:     Mental Status: She is alert and oriented to person, place, and time. Mental status is at baseline.     Gait: Gait normal.  Psychiatric:        Mood and Affect: Mood normal.        Behavior: Behavior normal.        Thought Content: Thought content normal.      No results found for any visits on 11/02/22.     Assessment & Plan:    Routine Health Maintenance and Physical Exam  Immunization History  Administered Date(s) Administered   Tdap 12/03/2018    Health Maintenance  Topic Date Due   COVID-19 Vaccine (1) Never done   Zoster Vaccines- Shingrix (1 of 2) Never done   MAMMOGRAM  Never done   Diabetic kidney evaluation - Urine ACR  09/20/2022   HEMOGLOBIN A1C  10/19/2022   INFLUENZA VACCINE  11/08/2022   Fecal DNA (Cologuard)  03/19/2023   Diabetic kidney evaluation - eGFR measurement  04/21/2023   OPHTHALMOLOGY EXAM  06/08/2023   FOOT EXAM  11/02/2023   DTaP/Tdap/Td (2 - Td or Tdap) 12/02/2028   Hepatitis C Screening  Completed   HIV Screening  Completed   HPV VACCINES  Aged Out    Discussed health benefits of physical activity, and encouraged her to engage in regular exercise appropriate for her age and condition.  Problem List Items Addressed This Visit       Cardiovascular and Mediastinum   Hypertension associated with diabetes (HCC)    Managed with Amlodipine 10mg  daily. -Continue current  medication. -Blood pressure well-controlled at current visit.      Relevant Orders   Comprehensive metabolic panel     Respiratory   Allergic rhinitis with postnasal drip   OSA (obstructive sleep apnea)    History  of sleep apnea with intolerance to CPAP. Reports poor sleep quality and daytime fatigue. -Order new CPAP machine with nasal pillows mask and auto titrating feature. -If no improvement or continued intolerance, consider referral to sleep specialist for alternative treatment options.        Endocrine   Hypothyroidism    Managed with Synthroid daily. -Continue current medication. -Order thyroid function tests.      Relevant Orders   TSH   Hyperlipidemia associated with type 2 diabetes mellitus (HCC)    Managed with Crestor 5mg  daily. -Continue current medication. -Order cholesterol panel.      Relevant Orders   Comprehensive metabolic panel   Lipid panel   Diabetes mellitus (HCC)    Managed with Ozempic 1mg  weekly. -Continue current medication. -Order A1C, kidney and liver function tests. Assoc with HTN, HLD Last A1c well controlled      Relevant Orders   Urine Microalbumin w/creat. ratio   Hemoglobin A1c     Other   Depression    Managed with Effexor 225mg  daily XR. -Continue current medication.      Obesity    Discussed importance of healthy weight management Discussed diet and exercise       Other Visit Diagnoses     Encounter for annual physical exam    -  Primary   Relevant Orders   Urine Microalbumin w/creat. ratio   Hemoglobin A1c   TSH   Comprehensive metabolic panel   Lipid panel   Breast cancer screening by mammogram       Relevant Orders   MM 3D SCREENING MAMMOGRAM BILATERAL BREAST          Postnasal Drip: Chronic drainage, particularly at night. No current treatment. -Recommend Flonase nasal spray at bedtime.  General Health Maintenance / Followup Plans: -Perform diabetic foot exam today. -Order Cologuard test in  December. -Order mammogram and provide contact information for scheduling. -Order urine test for diabetes. -Schedule follow-up appointment in six months.        Return in about 6 months (around 05/05/2023) for chronic disease f/u.     Shirlee Latch, MD

## 2022-11-02 NOTE — Assessment & Plan Note (Signed)
Managed with Ozempic 1mg  weekly. -Continue current medication. -Order A1C, kidney and liver function tests. Assoc with HTN, HLD Last A1c well controlled

## 2022-11-02 NOTE — Assessment & Plan Note (Signed)
History of sleep apnea with intolerance to CPAP. Reports poor sleep quality and daytime fatigue. -Order new CPAP machine with nasal pillows mask and auto titrating feature. -If no improvement or continued intolerance, consider referral to sleep specialist for alternative treatment options.

## 2022-11-02 NOTE — Assessment & Plan Note (Signed)
Discussed importance of healthy weight management Discussed diet and exercise  

## 2022-11-02 NOTE — Patient Instructions (Signed)
Please call [facility] to schedule your annual routine mammogram [phone]  

## 2022-11-02 NOTE — Assessment & Plan Note (Signed)
Managed with Crestor 5mg  daily. -Continue current medication. -Order cholesterol panel.

## 2022-11-05 ENCOUNTER — Other Ambulatory Visit: Payer: Self-pay

## 2022-11-05 ENCOUNTER — Telehealth: Payer: Self-pay

## 2022-11-05 DIAGNOSIS — E039 Hypothyroidism, unspecified: Secondary | ICD-10-CM

## 2022-11-05 MED ORDER — LEVOTHYROXINE SODIUM 75 MCG PO TABS
75.0000 ug | ORAL_TABLET | Freq: Every day | ORAL | 1 refills | Status: DC
Start: 2022-11-05 — End: 2023-04-26

## 2022-11-05 NOTE — Telephone Encounter (Signed)
-----   Message from Shirlee Latch sent at 11/05/2022 12:49 PM EDT ----- Normal/stable labs, except elevated A1c and TSH.  Good improvement in cholesterol.  A1c is at 7 - we want under 7.  We can consider metformin to bring it down vs decreasing carbs in diet and rechecking in 3 months (let's set a follow up).  If you want to take metformin, [facility] can send in 500mg  BID #60 r3.  TSH is also elevated slightly suggesting too little Synthroid.  If not taking it regularly, please resume. If you are taking it regularly, recommend increasing the dose to 75 mcg daily ([facility] ok to send in #90 r1 if patient agrees). Repeat TSH in 2-3 months (at our f/u in 22m).

## 2022-11-05 NOTE — Telephone Encounter (Signed)
Patient was informed of lab results per Dr. Beryle Flock. Patient agreed to going up to 75 mcg on Levothyroxine but declined Metformin.

## 2022-11-12 ENCOUNTER — Other Ambulatory Visit: Payer: Self-pay | Admitting: Family Medicine

## 2022-11-15 ENCOUNTER — Inpatient Hospital Stay
Admission: RE | Admit: 2022-11-15 | Discharge: 2022-11-15 | Disposition: A | Discharge status: Home / Self Care | Payer: Self-pay | Source: Ambulatory Visit | Attending: Family Medicine | Admitting: Family Medicine

## 2022-11-15 ENCOUNTER — Other Ambulatory Visit: Payer: Self-pay | Admitting: *Deleted

## 2022-11-15 DIAGNOSIS — Z1231 Encounter for screening mammogram for malignant neoplasm of breast: Secondary | ICD-10-CM

## 2022-11-20 ENCOUNTER — Telehealth: Payer: Self-pay

## 2022-11-20 DIAGNOSIS — G4733 Obstructive sleep apnea (adult) (pediatric): Secondary | ICD-10-CM

## 2022-11-20 NOTE — Telephone Encounter (Signed)
Beth from [employer] patient called back to let us know that patient will need a new PSG. She reports office notes for 11/02/22 report that patient failed compliance/failed tolerance on CPAP.   Please advise.

## 2022-11-22 NOTE — Telephone Encounter (Signed)
Lmtcb to let us know she would like for [place] to place order for new sleep study.

## 2022-11-22 NOTE — Telephone Encounter (Signed)
Please see if patient is willing to repeat PSG and ok to order if she is

## 2022-11-23 NOTE — Telephone Encounter (Signed)
Patient accepted to do new sleep study.

## 2022-11-27 ENCOUNTER — Encounter: Payer: Self-pay | Admitting: Family Medicine

## 2022-11-27 ENCOUNTER — Ambulatory Visit: Payer: 59 | Admitting: Family Medicine

## 2022-11-27 VITALS — BP 128/73 | HR 84 | Temp 98.2°F | Resp 12 | Ht 64.0 in | Wt 200.7 lb

## 2022-11-27 DIAGNOSIS — R300 Dysuria: Secondary | ICD-10-CM | POA: Diagnosis not present

## 2022-11-27 DIAGNOSIS — R319 Hematuria, unspecified: Secondary | ICD-10-CM | POA: Diagnosis not present

## 2022-11-27 DIAGNOSIS — G4733 Obstructive sleep apnea (adult) (pediatric): Secondary | ICD-10-CM

## 2022-11-27 LAB — POCT URINALYSIS DIPSTICK
Bilirubin, UA: NEGATIVE
Glucose, UA: POSITIVE — AB
Ketones, UA: NEGATIVE
Nitrite, UA: NEGATIVE
Protein, UA: POSITIVE — AB
Spec Grav, UA: 1.015 (ref 1.010–1.025)
Urobilinogen, UA: 0.2 E.U./dL
pH, UA: 6 (ref 5.0–8.0)

## 2022-11-27 MED ORDER — CEPHALEXIN 500 MG PO CAPS: 500.0000 mg | ORAL_CAPSULE | Freq: Three times a day (TID) | ORAL | 0 refills | Status: AC

## 2022-11-27 NOTE — Progress Notes (Signed)
Established Patient Office Visit  Subjective   Patient ID: Virginia Martin, female    DOB: 1965-06-06  Age: 57 y.o. MRN: [id number]  Chief Complaint  Patient presents with   Urinary Tract Infection    Virginia Martin is here today with concerns that she has a UTI. She has not had a UTI ever to her knowledge. Yesterday she started to have extreme urgency. She would go to the bathroom every 15 minutes and had a feeling of incomplete emptying each time. She endorsed some suprapubic pain yesterday. She denies systemic symptoms or back pain. She is having less symptoms today. She has not taken any medications to alleviate symptoms. She took an at home UTI test which was positive.   Review of Systems  Constitutional:  Negative for chills, fever and malaise/fatigue.  Genitourinary:  Positive for dysuria, frequency and urgency. Negative for flank pain.     Objective:     BP 128/73 (BP Location: Left Arm, Patient Position: Sitting, Cuff Size: Large)   Pulse 84   Temp 98.2 F (36.8 C) (Temporal)   Resp 12   Ht 5\' 4"  (1.626 m)   Wt 200 lb 11.2 oz (91 kg)   SpO2 98%   BMI 34.45 kg/m    Physical Exam Constitutional:      Appearance: Normal appearance.  Abdominal:     General: Abdomen is flat.     Palpations: Abdomen is soft.  Neurological:     General: No focal deficit present.     Mental Status: She is alert and oriented to person, place, and time.     Results for orders placed or performed in visit on 11/27/22  POCT urinalysis dipstick  Result Value Ref Range   Color, UA yellow    Clarity, UA clear    Glucose, UA Positive (A) Negative   Bilirubin, UA negative    Ketones, UA negative    Spec Grav, UA 1.015 1.010 - 1.025   Blood, UA large    pH, UA 6.0 5.0 - 8.0   Protein, UA Positive (A) Negative   Urobilinogen, UA 0.2 0.2 or 1.0 E.U./dL   Nitrite, UA negative    Leukocytes, UA Moderate (2+) (A) Negative  HM DIABETES EYE EXAM  Result Value Ref Range   HM Diabetic Eye Exam No  Retinopathy No Retinopathy    The 10-year ASCVD risk score (Arnett DK, et al., 2019) is: 3.9%    Assessment & Plan:   Problem List Items Addressed This Visit       Respiratory   OSA (obstructive sleep apnea)    Ambulatory referral for sleep study  Wants at home sleep study specifically       Relevant Orders   Ambulatory referral to Sleep Studies     Other   Dysuria - Primary    Acute  UA suggestive of UTI  Urine culture pending  Microscopy pending  Take Keflex 500 mg TID for 5 days        Relevant Orders   POCT urinalysis dipstick (Completed)   Urine Culture   Hematuria    Acute  Likely related to UTI Microscopy pending  Repeat UA in 6 weeks to assess if hematuria resolved       Relevant Orders   Urinalysis, microscopic only    Return if symptoms worsen or fail to improve.    Virginia Martin, Medical Student  Patient seen along with MS3 student Virginia Martin. I personally evaluated this patient along with  the student, and verified all aspects of the history, physical exam, and medical decision making as documented by the student. I agree with the student's documentation and have made all necessary edits.  Virginia Martin, Marzella Schlein, MD, MPH [facility]

## 2022-11-27 NOTE — Assessment & Plan Note (Signed)
Acute  UA suggestive of UTI  Urine culture pending  Microscopy pending  Take Keflex 500 mg TID for 5 days

## 2022-11-27 NOTE — Assessment & Plan Note (Signed)
Ambulatory referral for sleep study  Wants at home sleep study specifically

## 2022-11-27 NOTE — Assessment & Plan Note (Signed)
Acute  Likely related to UTI Microscopy pending  Repeat UA in 6 weeks to assess if hematuria resolved

## 2022-11-28 LAB — URINALYSIS, MICROSCOPIC ONLY
Casts: NONE SEEN /LPF
RBC, Urine: >30 /HPF — AB (ref 0–2)
WBC, UA: >30 /HPF — AB (ref 0–5)

## 2022-11-29 ENCOUNTER — Telehealth: Payer: Self-pay

## 2022-11-29 ENCOUNTER — Encounter: Payer: Self-pay | Admitting: *Deleted

## 2022-11-29 ENCOUNTER — Ambulatory Visit
Admission: RE | Admit: 2022-11-29 | Discharge: 2022-11-29 | Disposition: A | Discharge status: Home / Self Care | Payer: 59 | Source: Ambulatory Visit | Attending: Family Medicine | Admitting: Family Medicine

## 2022-11-29 DIAGNOSIS — Z1231 Encounter for screening mammogram for malignant neoplasm of breast: Secondary | ICD-10-CM | POA: Diagnosis present

## 2022-11-29 DIAGNOSIS — R319 Hematuria, unspecified: Secondary | ICD-10-CM

## 2022-11-29 NOTE — Telephone Encounter (Signed)
-----   Message from Shirlee Latch sent at 11/29/2022 11:25 AM EDT ----- Microscope exam confirms blood in urine. Repeat Urinalysis with microscopy (send off) in 6 weeks.

## 2022-11-30 LAB — URINE CULTURE

## 2022-12-03 ENCOUNTER — Other Ambulatory Visit: Payer: Self-pay | Admitting: Family Medicine

## 2022-12-03 DIAGNOSIS — R928 Other abnormal and inconclusive findings on diagnostic imaging of breast: Secondary | ICD-10-CM

## 2022-12-12 ENCOUNTER — Ambulatory Visit
Admission: RE | Admit: 2022-12-12 | Discharge: 2022-12-12 | Disposition: A | Discharge status: Home / Self Care | Payer: 59 | Source: Ambulatory Visit | Attending: Family Medicine | Admitting: Family Medicine

## 2022-12-12 ENCOUNTER — Other Ambulatory Visit: Payer: Self-pay | Admitting: Family Medicine

## 2022-12-12 DIAGNOSIS — N63 Unspecified lump in unspecified breast: Secondary | ICD-10-CM

## 2022-12-12 DIAGNOSIS — R928 Other abnormal and inconclusive findings on diagnostic imaging of breast: Secondary | ICD-10-CM

## 2022-12-13 ENCOUNTER — Telehealth: Payer: Self-pay

## 2022-12-13 DIAGNOSIS — G4733 Obstructive sleep apnea (adult) (pediatric): Secondary | ICD-10-CM

## 2022-12-13 NOTE — Telephone Encounter (Signed)
Copied from CRM [id number]. Topic: Referral - Status >> Dec 13, 2022  8:51 AM Dondra Prader A wrote: Reason for CRM: Amy with [facility] states that they received a referral for the pt for in lab study. Pt is requesting home sleep study. Per Amy could a new referral be sent over for home sleep study. Please advise.

## 2022-12-19 ENCOUNTER — Ambulatory Visit
Admission: RE | Admit: 2022-12-19 | Discharge: 2022-12-19 | Disposition: A | Discharge status: Home / Self Care | Payer: 59 | Source: Ambulatory Visit | Attending: Family Medicine | Admitting: Family Medicine

## 2022-12-19 ENCOUNTER — Ambulatory Visit
Admission: RE | Admit: 2022-12-19 | Discharge: 2022-12-19 | Disposition: A | Discharge status: Home / Self Care | Payer: 59 | Source: Ambulatory Visit | Attending: Family Medicine

## 2022-12-19 DIAGNOSIS — R928 Other abnormal and inconclusive findings on diagnostic imaging of breast: Secondary | ICD-10-CM

## 2022-12-19 DIAGNOSIS — D241 Benign neoplasm of right breast: Secondary | ICD-10-CM | POA: Insufficient documentation

## 2022-12-19 DIAGNOSIS — N6001 Solitary cyst of right breast: Secondary | ICD-10-CM | POA: Insufficient documentation

## 2022-12-19 DIAGNOSIS — Z86000 Personal history of in-situ neoplasm of breast: Secondary | ICD-10-CM | POA: Insufficient documentation

## 2022-12-19 DIAGNOSIS — N6021 Fibroadenosis of right breast: Secondary | ICD-10-CM | POA: Diagnosis not present

## 2022-12-19 DIAGNOSIS — N63 Unspecified lump in unspecified breast: Secondary | ICD-10-CM

## 2022-12-19 HISTORY — PX: BREAST BIOPSY: SHX20

## 2022-12-19 MED ORDER — LIDOCAINE HCL (PF) 1 % IJ SOLN
5.0000 mL | Freq: Once | INTRAMUSCULAR | Status: DC
  Filled 2022-12-19: qty 5

## 2022-12-19 MED ORDER — LIDOCAINE-EPINEPHRINE 1 %-1:100000 IJ SOLN
10.0000 mL | Freq: Once | INTRAMUSCULAR | Status: AC
  Administered 2022-12-19: 10 mL
  Filled 2022-12-19: qty 10

## 2022-12-19 MED ORDER — LIDOCAINE-EPINEPHRINE 1 %-1:100000 IJ SOLN
10.0000 mL | Freq: Once | INTRAMUSCULAR | Status: DC
  Filled 2022-12-19: qty 10

## 2022-12-19 MED ORDER — LIDOCAINE 1 % OPTIME INJ - NO CHARGE
5.0000 mL | Freq: Once | INTRAMUSCULAR | Status: AC
  Administered 2022-12-19: 5 mL
  Filled 2022-12-19: qty 6

## 2022-12-20 ENCOUNTER — Telehealth: Payer: Self-pay

## 2022-12-20 DIAGNOSIS — D369 Benign neoplasm, unspecified site: Secondary | ICD-10-CM

## 2022-12-20 LAB — SURGICAL PATHOLOGY

## 2022-12-20 NOTE — Telephone Encounter (Signed)
Amy with [facility] calling back again about status of pt getting a home sleep study instead of in lab. Please cb

## 2022-12-20 NOTE — Telephone Encounter (Signed)
Amy reports order was not received. Would like to know if it could be refaxed.  Fax # [phone]  Order faxed. Placed at my desk until Monday to confirm received

## 2022-12-20 NOTE — Telephone Encounter (Signed)
Spoke with Virginia Martin regarding her pathology. She does not have a preference regarding a Careers adviser. Referral sent to [facility].

## 2022-12-22 ENCOUNTER — Other Ambulatory Visit: Payer: Self-pay | Admitting: Family Medicine

## 2022-12-24 NOTE — Telephone Encounter (Signed)
Requested Prescriptions  Pending Prescriptions Disp Refills   venlafaxine XR (EFFEXOR-XR) 75 MG 24 hr capsule [Pharmacy Med Name: VENLAFAXINE HCL ER 75 MG CAP] 90 capsule 1    Sig: TAKE 1 CAPSULE BY MOUTH DAILY WITH BREAKFAST. TAKE WITH THE 150 DOSE TO MAKE 225MG  DAILY.     Psychiatry: Antidepressants - SNRI - desvenlafaxine & venlafaxine Failed - 12/22/2022  1:07 AM      Failed - Lipid Panel in normal range within the last 12 months    Cholesterol, Total  Date Value Ref Range Status  11/02/2022 138 100 - 199 mg/dL Final   LDL Chol Calc (NIH)  Date Value Ref Range Status  11/02/2022 60 0 - 99 mg/dL Final   HDL  Date Value Ref Range Status  11/02/2022 51 >39 mg/dL Final   Triglycerides  Date Value Ref Range Status  11/02/2022 159 (H) 0 - 149 mg/dL Final         Passed - Cr in normal range and within 360 days    Creatinine, Ser  Date Value Ref Range Status  11/02/2022 0.95 0.57 - 1.00 mg/dL Final         Passed - Completed PHQ-2 or PHQ-9 in the last 360 days      Passed - Last BP in normal range    BP Readings from Last 1 Encounters:  11/27/22 128/73         Passed - Valid encounter within last 6 months    Recent Outpatient Visits           3 weeks ago Dysuria   [facility] [place], Marzella Schlein, MD   1 month ago Encounter for annual physical exam   [place] [facility] [place], Marzella Schlein, MD   8 months ago Hypertension associated with diabetes [facility])   [place] [facility] [place], Marzella Schlein, MD   1 year ago Type 2 diabetes mellitus with other specified complication, without long-term current use of insulin [facility])   [place] [facility] [place], Marzella Schlein, MD   1 year ago Primary hypertension    [facility] [place], Marzella Schlein, MD       Future Appointments             In 4 months Bacigalupo, Marzella Schlein, MD [facility]

## 2022-12-27 ENCOUNTER — Telehealth: Payer: Self-pay | Admitting: Family Medicine

## 2022-12-27 NOTE — Telephone Encounter (Signed)
Amy calling from [facility] is calling to request if orders for Home Sleep study can be re faxed. Please advise [phone]- [phone]

## 2022-12-28 NOTE — Telephone Encounter (Signed)
I do not see a order in the chart.  Virginia Martin is this something maybe you sent and can send again if so?

## 2022-12-31 ENCOUNTER — Ambulatory Visit: Payer: 59 | Admitting: Surgery

## 2022-12-31 ENCOUNTER — Encounter: Payer: Self-pay | Admitting: Surgery

## 2022-12-31 VITALS — BP 146/85 | HR 71 | Temp 98.0°F | Ht 64.5 in | Wt 201.4 lb

## 2022-12-31 DIAGNOSIS — D0511 Intraductal carcinoma in situ of right breast: Secondary | ICD-10-CM

## 2022-12-31 DIAGNOSIS — D369 Benign neoplasm, unspecified site: Secondary | ICD-10-CM

## 2022-12-31 NOTE — Patient Instructions (Signed)
We have spoken today about removing a lump in your breast. This will be done by Dr. Everlene Farrier at [facility].  You will most likely be able to leave the hospital several hours after your surgery. Rarely, a patient needs to stay over night but this is a possibility.  Plan to tentatively be off work for 1-2 weeks following the surgery and may return with approximately 2 more weeks of a lifting restriction, no greater than 15 lbs.  Please see your Blue surgery sheet for more information. Our surgery scheduler will call you to look at surgery dates and to go over information.   If you have FMLA or Disability paperwork that needs to be filled out, please have your company fax your paperwork to [phone] or you may drop this by either office. This paperwork will be filled out within 3 days after your surgery has been completed.    Lumpectomy A lumpectomy is a form of "breast conserving" or "breast preservation" surgery. It may also be referred to as a partial mastectomy. During a lumpectomy, the portion of the breast that contains the cancerous tumor or breast mass (the lump) is removed. Some normal tissue around the lump may also be removed to make sure all of the tumor has been removed.  LET [facility] CARE PROVIDER KNOW ABOUT: Any allergies you have. All medicines you are taking, including vitamins, herbs, eye drops, creams, and over-the-counter medicines. Previous problems you or members of your family have had with the use of anesthetics. Any blood disorders you have. Previous surgeries you have had. Medical conditions you have. RISKS AND COMPLICATIONS Generally, this is a safe procedure. However, problems can occur and include: Bleeding. Infection. Pain. Temporary swelling. Change in the shape of the breast, particularly if a large portion is removed. BEFORE THE PROCEDURE Ask your health care provider about changing or stopping your regular medicines. This is especially important if you are  taking diabetes medicines or blood thinners. Do not eat or drink anything after midnight on the night before the procedure or as directed by your health care provider. Ask your health care provider if you can take a sip of water with any approved medicines. On the day of surgery, your health care provider will use a mammogram or ultrasound to locate and mark the tumor in your breast. These markings on your breast will show where the cut (incision) will be made.   PROCEDURE  An IV tube will be put into one of your veins. You may be given medicine to help you relax before the surgery (sedative). You will be given one of the following: A medicine that numbs the area (local anesthetic). A medicine that makes you fall asleep (general anesthetic). Your health care provider will use a kind of electric scalpel that uses heat to minimize bleeding (electrocautery knife). A curved incision (like a smile or frown) that follows the natural curve of your breast is made, to allow for minimal scarring and better healing. The tumor will be removed with some of the surrounding tissue. This will be sent to the lab for analysis. Your health care provider may also remove your lymph nodes at this time if needed. Sometimes, but not always, a rubber tube called a drain will be surgically inserted into your breast area or armpit to collect excess fluid that may accumulate in the space where the tumor was. This drain is connected to a plastic bulb on the outside of your body. This drain creates suction to  help remove the fluid. The incisions will be closed with stitches (sutures). A bandage may be placed over the incisions. AFTER THE PROCEDURE You will be taken to the recovery area. You will be given medicine for pain. A small rubber drain may be placed in the breast for 2-3 days to prevent a collection of blood (hematoma) from developing in the breast. You will be given instructions on caring for the drain before you go  home. A pressure bandage (dressing) will be applied for 1-2 days to prevent bleeding. Ask your health care provider how to care for your bandage at home.   This information is not intended to replace advice given to you by your health care provider. Make sure you discuss any questions you have with your health care provider.   Document Released: 05/07/2006 Document Revised: 04/16/2014 Document Reviewed: 08/29/2012 Elsevier Interactive Patient Education [employer]

## 2023-01-01 ENCOUNTER — Other Ambulatory Visit: Payer: Self-pay | Admitting: Surgery

## 2023-01-01 DIAGNOSIS — R928 Other abnormal and inconclusive findings on diagnostic imaging of breast: Secondary | ICD-10-CM

## 2023-01-02 ENCOUNTER — Telehealth: Payer: Self-pay | Admitting: Surgery

## 2023-01-02 NOTE — Progress Notes (Signed)
Patient ID: Virginia Martin, female   DOB: 1965-05-23, 57 y.o.   MRN: [id number]  HPI Virginia Martin is a 57 y.o. female she does have a history of left DCIS ER + PR + in 2007 and underwent lumpectomy w/o radiation.  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  She does have significant associated deformity to both breast. She reports no breast concerns , no discharge, no breast masses. She did have a recent mammogram that I have personally reviewed showing asymmetry on the right breast at 10:00 measuring 9 mm.  She also had another area at 9:00.  She underwent ultrasound guided biopsy of both lesions  PATHOLOGY revealed: Site 1. Breast, right, needle core biopsy, 10 o'clock, 8 cmfn, ribbon : - FRAGMENTS OF INTRADUCTAL PAPILLOMA. - COLUMNAR CELL CHANGE. - NEGATIVE FOR ATYPIA AND MALIGNANCY.  HPI  Past Medical History:  Diagnosis Date   Cancer [facility])    Depression    Thyroid disease     Past Surgical History:  Procedure Laterality Date   ABDOMINAL HYSTERECTOMY  04/09/2000   APPENDECTOMY  04/09/2000   BREAST BIOPSY  04/09/2004   BREAST BIOPSY Right    [place] Core Bx 10:00 Ribbon Clip- path pending   BREAST BIOPSY Right 12/19/2022   [place] Core Bx, 9:00 Coil Clip- path pending   BREAST BIOPSY Right 12/19/2022   [place] RT BREAST BX W LOC DEV 1ST LESION IMG BX SPEC US GUIDE 12/19/2022 [facility]   BREAST BIOPSY Right 12/19/2022   [place] RT BREAST BX W LOC DEV EA ADD LESION IMG BX SPEC US GUIDE 12/19/2022 [facility]   BREAST EXCISIONAL BIOPSY Right    benign 2018   BREAST LUMPECTOMY Left    2006 DCIS   INCISION AND DRAINAGE ABSCESS N/A 07/25/2015   Procedure: INCISION AND DRAINAGE ABSCESS / GLUTEAL;  Surgeon: Ricarda Frame, MD;  Location: [facility] OR[facility];  Service: General;  Laterality: N/A;   WRIST SURGERY Left 1991-1993    Family History  Problem Relation Age of Onset   Depression Mother    Colon polyps  Mother    Heart attack Father    Hypertension Father    Diabetes Father    Depression Sister    Diabetes Maternal Grandmother    Cancer Maternal Grandmother    COPD Paternal Grandmother     Social History Social History   Tobacco Use   Smoking status: Never   Smokeless tobacco: Never  Substance Use Topics   Alcohol use: No    Alcohol/week: 0.0 standard drinks of alcohol   Drug use: No    Allergies  Allergen Reactions   Clindamycin Hcl Diarrhea    Other Reaction: GI trouble-hospitalized   Codeine Rash   Gabapentin Rash    Other Reaction: red skin  Other Reaction(s): flushing   Amoxicillin Itching    Other reaction(s): Other (See Comments) Other Reaction: blisters   Diphenhydramine Hcl Other (See Comments)    Other Reaction: racing heart    Current Outpatient Medications  Medication Sig Dispense Refill   amLODipine (NORVASC) 10 MG tablet TAKE 1 TABLET BY MOUTH EVERY DAY 90 tablet 3   ibuprofen (ADVIL,MOTRIN) 600 MG tablet Take 1 tablet (600 mg total) by mouth every 6 (six) hours as needed. 30 tablet 0   levothyroxine (SYNTHROID) 75 MCG tablet Take 1 tablet (75 mcg total) by mouth daily. 90 tablet 1   rosuvastatin (CRESTOR) 5 MG  tablet TAKE 1 TABLET (5 MG TOTAL) BY MOUTH DAILY. 90 tablet 1   Semaglutide, 1 MG/DOSE, (OZEMPIC, 1 MG/DOSE,) 4 MG/3ML SOPN INJECT 1 MG ONCE A WEEK AS DIRECTED 3 mL 2   venlafaxine XR (EFFEXOR-XR) 150 MG 24 hr capsule TAKE 1 CAPSULE BY MOUTH EVERY DAY 90 capsule 0   venlafaxine XR (EFFEXOR-XR) 75 MG 24 hr capsule TAKE 1 CAPSULE BY MOUTH DAILY WITH BREAKFAST. TAKE WITH THE 150 DOSE TO MAKE 225MG  DAILY. 90 capsule 1   No current facility-administered medications for this visit.     Review of Systems Full ROS  was asked and was negative except for the information on the HPI  Physical Exam Blood pressure (!) 146/85, pulse 71, temperature 98 F (36.7 C), temperature source Oral, height 5' 4.5" (1.638 m), weight 201 lb 6.4 oz (91.4 kg), SpO2  99%. CONSTITUTIONAL: NAD. EYES: Pupils are equal, round, , Sclera are non-icteric. EARS, NOSE, MOUTH AND THROAT: The oropharynx is clear. The oral mucosa is pink and moist. Hearing is intact to voice. LYMPH NODES:  Lymph nodes in the neck are normal. RESPIRATORY:  Lungs are clear. There is normal respiratory effort, with equal breath sounds bilaterally, and without pathologic use of accessory muscles. CARDIOVASCULAR: Heart is regular without murmurs, gallops, or rubs. BREAST: Evidence of a small biopsy site on the right breast at 10:00.  There is no palpable lesions there is no nipple discharge.  There is no evidence of axillary lymphadenopathy.  There is also evidence of right breast deformity due to prior lumpectomy. GI: The abdomen is  soft, nontender, and nondistended. There are no palpable masses. There is no hepatosplenomegaly. There are normal bowel sounds in all quadrants. GU: Rectal deferred.   MUSCULOSKELETAL: Normal muscle strength and tone. No cyanosis or edema.   SKIN: Turgor is good and there are no pathologic skin lesions or ulcers. NEUROLOGIC: Motor and sensation is grossly normal. Cranial nerves are grossly intact. PSYCH:  Oriented to person, place and time. Affect is normal.  Data Reviewed  I have personally reviewed the patient's imaging, laboratory findings and medical records.    Assessment/Plan  57 year old female with evidence of asymmetry in the right breast and recent image guided biopsy consistent with intraductal papilloma.  Prior hx of DCIS on contralateral breast and mulitple bilateral breast biopsies. Discussed with patient in details about options.  Surgical excision versus close follow-up.  Patient wishes to proceed with excisional biopsy of the right breast for peace of mind and to make sure there is no other potential areas of concern.  I discussed with the patient in detail about her disease process and about the proposed surgery.  Will be utilizing Savi scout  for right lumpectomy.  Procedure discussed with her and her husband in detail.  Risk, benefits and possible complications including but not limited to: Bleeding, infection, deformity, seroma formation.  Please note that the patient has already had a prior lumpectomy with and significant deformity to the right breast.  She understands this.  Please note I spent 55 minutes in this encounter including personally reviewing imaging studies, medical record, placing orders and performing appropriate documentation.  Sterling Big, MD FACS General Surgeon 01/02/2023, 9:07 PM

## 2023-01-02 NOTE — Telephone Encounter (Signed)
Patient has been advised of Pre-Admission date/time, and Surgery date at [facility]  Surgery Date: 01/29/23 Preadmission Testing Date: 01/22/23 (phone 1p-4p)  Patient has been made aware to call [phone], between 1-3:00pm the day before surgery, to find out what time to arrive for surgery.    Patient also reminded of breast tag to be placed at [facility] 01/09/23.

## 2023-01-02 NOTE — H&P (View-Only) (Signed)
Patient ID: Virginia Martin, female   DOB: 1965-05-23, 57 y.o.   MRN: [id number]  HPI Virginia Martin is a 57 y.o. female she does have a history of left DCIS ER + PR + in 2007 and underwent lumpectomy w/o radiation.  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  She does have significant associated deformity to both breast. She reports no breast concerns , no discharge, no breast masses. She did have a recent mammogram that I have personally reviewed showing asymmetry on the right breast at 10:00 measuring 9 mm.  She also had another area at 9:00.  She underwent ultrasound guided biopsy of both lesions  PATHOLOGY revealed: Site 1. Breast, right, needle core biopsy, 10 o'clock, 8 cmfn, ribbon : - FRAGMENTS OF INTRADUCTAL PAPILLOMA. - COLUMNAR CELL CHANGE. - NEGATIVE FOR ATYPIA AND MALIGNANCY.  HPI  Past Medical History:  Diagnosis Date   Cancer [facility])    Depression    Thyroid disease     Past Surgical History:  Procedure Laterality Date   ABDOMINAL HYSTERECTOMY  04/09/2000   APPENDECTOMY  04/09/2000   BREAST BIOPSY  04/09/2004   BREAST BIOPSY Right    [place] Core Bx 10:00 Ribbon Clip- path pending   BREAST BIOPSY Right 12/19/2022   [place] Core Bx, 9:00 Coil Clip- path pending   BREAST BIOPSY Right 12/19/2022   [place] RT BREAST BX W LOC DEV 1ST LESION IMG BX SPEC US GUIDE 12/19/2022 [facility]   BREAST BIOPSY Right 12/19/2022   [place] RT BREAST BX W LOC DEV EA ADD LESION IMG BX SPEC US GUIDE 12/19/2022 [facility]   BREAST EXCISIONAL BIOPSY Right    benign 2018   BREAST LUMPECTOMY Left    2006 DCIS   INCISION AND DRAINAGE ABSCESS N/A 07/25/2015   Procedure: INCISION AND DRAINAGE ABSCESS / GLUTEAL;  Surgeon: Ricarda Frame, MD;  Location: [facility];  Service: General;  Laterality: N/A;   WRIST SURGERY Left 1991-1993    Family History  Problem Relation Age of Onset   Depression Mother    Colon polyps  Mother    Heart attack Father    Hypertension Father    Diabetes Father    Depression Sister    Diabetes Maternal Grandmother    Cancer Maternal Grandmother    COPD Paternal Grandmother     Social History Social History   Tobacco Use   Smoking status: Never   Smokeless tobacco: Never  Substance Use Topics   Alcohol use: No    Alcohol/week: 0.0 standard drinks of alcohol   Drug use: No    Allergies  Allergen Reactions   Clindamycin Hcl Diarrhea    Other Reaction: GI trouble-hospitalized   Codeine Rash   Gabapentin Rash    Other Reaction: red skin  Other Reaction(s): flushing   Amoxicillin Itching    Other reaction(s): Other (See Comments) Other Reaction: blisters   Diphenhydramine Hcl Other (See Comments)    Other Reaction: racing heart    Current Outpatient Medications  Medication Sig Dispense Refill   amLODipine (NORVASC) 10 MG tablet TAKE 1 TABLET BY MOUTH EVERY DAY 90 tablet 3   ibuprofen (ADVIL,MOTRIN) 600 MG tablet Take 1 tablet (600 mg total) by mouth every 6 (six) hours as needed. 30 tablet 0   levothyroxine (SYNTHROID) 75 MCG tablet Take 1 tablet (75 mcg total) by mouth daily. 90 tablet 1   rosuvastatin (CRESTOR) 5 MG  tablet TAKE 1 TABLET (5 MG TOTAL) BY MOUTH DAILY. 90 tablet 1   Semaglutide, 1 MG/DOSE, (OZEMPIC, 1 MG/DOSE,) 4 MG/3ML SOPN INJECT 1 MG ONCE A WEEK AS DIRECTED 3 mL 2   venlafaxine XR (EFFEXOR-XR) 150 MG 24 hr capsule TAKE 1 CAPSULE BY MOUTH EVERY DAY 90 capsule 0   venlafaxine XR (EFFEXOR-XR) 75 MG 24 hr capsule TAKE 1 CAPSULE BY MOUTH DAILY WITH BREAKFAST. TAKE WITH THE 150 DOSE TO MAKE 225MG  DAILY. 90 capsule 1   No current facility-administered medications for this visit.     Review of Systems Full ROS  was asked and was negative except for the information on the HPI  Physical Exam Blood pressure (!) 146/85, pulse 71, temperature 98 F (36.7 C), temperature source Oral, height 5' 4.5" (1.638 m), weight 201 lb 6.4 oz (91.4 kg), SpO2  99%. CONSTITUTIONAL: NAD. EYES: Pupils are equal, round, , Sclera are non-icteric. EARS, NOSE, MOUTH AND THROAT: The oropharynx is clear. The oral mucosa is pink and moist. Hearing is intact to voice. LYMPH NODES:  Lymph nodes in the neck are normal. RESPIRATORY:  Lungs are clear. There is normal respiratory effort, with equal breath sounds bilaterally, and without pathologic use of accessory muscles. CARDIOVASCULAR: Heart is regular without murmurs, gallops, or rubs. BREAST: Evidence of a small biopsy site on the right breast at 10:00.  There is no palpable lesions there is no nipple discharge.  There is no evidence of axillary lymphadenopathy.  There is also evidence of right breast deformity due to prior lumpectomy. GI: The abdomen is  soft, nontender, and nondistended. There are no palpable masses. There is no hepatosplenomegaly. There are normal bowel sounds in all quadrants. GU: Rectal deferred.   MUSCULOSKELETAL: Normal muscle strength and tone. No cyanosis or edema.   SKIN: Turgor is good and there are no pathologic skin lesions or ulcers. NEUROLOGIC: Motor and sensation is grossly normal. Cranial nerves are grossly intact. PSYCH:  Oriented to person, place and time. Affect is normal.  Data Reviewed  I have personally reviewed the patient's imaging, laboratory findings and medical records.    Assessment/Plan  57 year old female with evidence of asymmetry in the right breast and recent image guided biopsy consistent with intraductal papilloma.  Prior hx of DCIS on contralateral breast and mulitple bilateral breast biopsies. Discussed with patient in details about options.  Surgical excision versus close follow-up.  Patient wishes to proceed with excisional biopsy of the right breast for peace of mind and to make sure there is no other potential areas of concern.  I discussed with the patient in detail about her disease process and about the proposed surgery.  Will be utilizing Savi scout  for right lumpectomy.  Procedure discussed with her and her husband in detail.  Risk, benefits and possible complications including but not limited to: Bleeding, infection, deformity, seroma formation.  Please note that the patient has already had a prior lumpectomy with and significant deformity to the right breast.  She understands this.  Please note I spent 55 minutes in this encounter including personally reviewing imaging studies, medical record, placing orders and performing appropriate documentation.  Sterling Big, MD FACS General Surgeon 01/02/2023, 9:07 PM

## 2023-01-09 ENCOUNTER — Ambulatory Visit
Admission: RE | Admit: 2023-01-09 | Discharge: 2023-01-09 | Disposition: A | Discharge status: Home / Self Care | Payer: 59 | Source: Ambulatory Visit | Attending: Surgery | Admitting: Surgery

## 2023-01-09 DIAGNOSIS — R928 Other abnormal and inconclusive findings on diagnostic imaging of breast: Secondary | ICD-10-CM

## 2023-01-09 HISTORY — PX: BREAST BIOPSY: SHX20

## 2023-01-09 MED ORDER — LIDOCAINE HCL 1 % IJ SOLN
10.0000 mL | Freq: Once | INTRAMUSCULAR | Status: AC
  Administered 2023-01-09: 10 mL
  Filled 2023-01-09: qty 10

## 2023-01-22 ENCOUNTER — Encounter
Admission: RE | Admit: 2023-01-22 | Discharge: 2023-01-22 | Disposition: A | Discharge status: Home / Self Care | Payer: 59 | Source: Ambulatory Visit | Attending: Surgery | Admitting: Surgery

## 2023-01-22 ENCOUNTER — Other Ambulatory Visit: Payer: Self-pay

## 2023-01-22 VITALS — Ht 64.5 in | Wt 198.0 lb

## 2023-01-22 DIAGNOSIS — Z01812 Encounter for preprocedural laboratory examination: Secondary | ICD-10-CM

## 2023-01-22 DIAGNOSIS — E1159 Type 2 diabetes mellitus with other circulatory complications: Secondary | ICD-10-CM

## 2023-01-22 HISTORY — DX: Type 2 diabetes mellitus with other circulatory complications: E11.59

## 2023-01-22 HISTORY — DX: Type 2 diabetes mellitus without complications: E11.9

## 2023-01-22 HISTORY — DX: Family history of other specified conditions: Z84.89

## 2023-01-22 HISTORY — DX: Hypertension secondary to endocrine disorders: I15.2

## 2023-01-22 HISTORY — DX: Hypothyroidism, unspecified: E03.9

## 2023-01-22 HISTORY — DX: Hyperlipidemia, unspecified: E78.5

## 2023-01-22 NOTE — Patient Instructions (Signed)
Your procedure is scheduled on: Tuesday, October 22 Report to the Registration Desk on the 1st floor of the [facility] To find out your arrival time, please call [phone] between 1PM - 3PM on: Monday, October 21 If your arrival time is 6:00 am, do not arrive before that time as the [facility] Mall entrance doors do not open until 6:00 am.  REMEMBER: Instructions that are not followed completely may result in serious medical risk, up to and including death; or upon the discretion of your surgeon and anesthesiologist your surgery may need to be rescheduled.  Do not eat food after midnight the night before surgery.  No gum chewing or hard candies.  You may however, drink water up to 2 hours before you are scheduled to arrive for your surgery. Do not drink anything within 2 hours of your scheduled arrival time.  One week prior to surgery: starting October 15 Stop Anti-inflammatories (NSAIDS) such as Advil, Aleve, Ibuprofen, Motrin, Naproxen, Naprosyn and Aspirin based products such as Excedrin, Goody's Powder, BC Powder. Stop ANY OVER THE COUNTER supplements until after surgery.  You may however, continue to take Tylenol if needed for pain up until the day of surgery.  Semaglutide (Ozempic) hold for 7 days before surgery. Do NOT take dose on Sunday, October 20. Resume on your regular day, Sunday, October 27.  Continue taking all of your other prescription medications up until the day of surgery.  DO NOT TAKE ANY MEDICATIONS ON THE DAY OF SURGERY   No Alcohol for 24 hours before or after surgery.  No Smoking including e-cigarettes for 24 hours before surgery.  No chewable tobacco products for at least 6 hours before surgery.  No nicotine patches on the day of surgery.  Do not use any "recreational" drugs for at least a week (preferably 2 weeks) before your surgery.  Please be advised that the combination of cocaine and anesthesia may have negative outcomes, up to and including  death. If you test positive for cocaine, your surgery will be cancelled.  On the morning of surgery brush your teeth with toothpaste and water, you may rinse your mouth with mouthwash if you wish. Do not swallow any toothpaste or mouthwash.  Use CHG Soap as directed on instruction sheet.  Do not wear jewelry, make-up, hairpins, clips or nail polish.  For welded (permanent) jewelry: bracelets, anklets, waist bands, etc.  Please have this removed prior to surgery.  If it is not removed, there is a chance that hospital personnel will need to cut it off on the day of surgery.  Do not wear lotions, powders, or perfumes.   Do not shave body hair from the neck down 48 hours before surgery.  Contact lenses, hearing aids and dentures may not be worn into surgery.  Do not bring valuables to the hospital. [facility] is not responsible for any missing/lost belongings or valuables.   Notify your doctor if there is any change in your medical condition (cold, fever, infection).  Wear comfortable clothing (specific to your surgery type) to the hospital.  After surgery, you can help prevent lung complications by doing breathing exercises.  Take deep breaths and cough every 1-2 hours.   If you are being discharged the day of surgery, you will not be allowed to drive home. You will need a responsible individual to drive you home and stay with you for 24 hours after surgery.   If you are taking public transportation, you will need to have a responsible  individual with you.  Please call the Pre-admissions Testing Dept. at [phone] if you have any questions about these instructions.  Surgery Visitation Policy:  Patients having surgery or a procedure may have two visitors.  Children under the age of 84 must have an adult with them who is not the patient.     Preparing for Surgery with CHLORHEXIDINE GLUCONATE (CHG) Soap  Chlorhexidine Gluconate (CHG) Soap  o An antiseptic cleaner that  kills germs and bonds with the skin to continue killing germs even after washing  o Used for showering the night before surgery and morning of surgery  Before surgery, you can play an important role by reducing the number of germs on your skin.  CHG (Chlorhexidine gluconate) soap is an antiseptic cleanser which kills germs and bonds with the skin to continue killing germs even after washing.  Please do not use if you have an allergy to CHG or antibacterial soaps. If your skin becomes reddened/irritated stop using the CHG.  1. Shower the NIGHT BEFORE SURGERY and the MORNING OF SURGERY with CHG soap.  2. If you choose to wash your hair, wash your hair first as usual with your normal shampoo.  3. After shampooing, rinse your hair and body thoroughly to remove the shampoo.  4. Use CHG as you would any other liquid soap. You can apply CHG directly to the skin and wash gently with a scrungie or a clean washcloth.  5. Apply the CHG soap to your body only from the neck down. Do not use on open wounds or open sores. Avoid contact with your eyes, ears, mouth, and genitals (private parts). Wash face and genitals (private parts) with your normal soap.  6. Wash thoroughly, paying special attention to the area where your surgery will be performed.  7. Thoroughly rinse your body with warm water.  8. Do not shower/wash with your normal soap after using and rinsing off the CHG soap.  9. Pat yourself dry with a clean towel.  10. Wear clean pajamas to bed the night before surgery.  12. Place clean sheets on your bed the night of your first shower and do not sleep with pets.  13. Shower again with the CHG soap on the day of surgery prior to arriving at the hospital.  14. Do not apply any deodorants/lotions/powders.  15. Please wear clean clothes to the hospital.

## 2023-01-24 NOTE — Progress Notes (Signed)
  Perioperative Services Pre-Admission/Anesthesia Testing    Date: 01/24/23  Name: Virginia Martin MRN:   [id number]  Re: GLP-1 clearance and provider recommendations   Planned Surgical Procedure(s):    Case: [id number] Date/Time: 01/29/23 0850   Procedure: BREAST LUMPECTOMY WITH RADIOFREQUENCY TAG IDENTIFICATION, SAVI scout tag (Right)   Anesthesia type: General   Pre-op diagnosis: breast papilloma   Location: [facility] OR ROOM 06 / [facility]   Surgeons: Leafy Ro, MD      Clinical Notes:  Patient is scheduled for the above procedure with the indicated provider/surgeon. In review of her medication reconciliation it was noted that patient is on a prescribed GLP-1 medication. Per guidelines issued by the American Society of Anesthesiologists (ASA), it is recommended that these medications be held for 7 days prior to the patient undergoing any type of elective surgical procedure. The patient is taking the following GLP-1 medication:  [x]  SEMAGLUTIDE   []  EXENATIDE  []  LIRAGLUTIDE   []  LIXISENATIDE  []  DULAGLUTIDE     []  TIRZEPATIDE (GLP-1/GIP)  Reached out to prescribing provider Beryle Flock, MD) to make them aware of the guidelines from anesthesia. Given that this patient takes the prescribed GLP-1 medication for her  diabetes diagnosis, rather than for weight loss, recommendations from the prescribing provider were solicited. Prescribing provider made aware of the following so that informed decision/POC can be developed for this patient that may be taking medications belonging to these drug classes:  Oral GLP-1 medications will be held 1 day prior to surgery.  Injectable GLP-1 medications will be held 7 days prior to surgery.  Metformin is routinely held 48 hours prior to surgery due to renal concerns, potential need for contrasted imaging perioperatively, and the potential for tissue hypoxia leading to drug induced lactic acidosis.  All SGLT2i medications are  held 72 hours prior to surgery as they can be associated with the increased potential for developing euglycemic diabetic ketoacidosis (EDKA).   Impression and Plan:  Virginia Martin is on a prescribed GLP-1 medication, which induces the known side effect of decreased gastric emptying. Efforts are bring made to mitigate the risk of perioperative hyperglycemic events, as elevated blood glucose levels have been found to contribute to intra/postoperative complications. Additionally, hyperglycemic extremes can potentially necessitate the postponing of a patient's elective case in order to better optimize perioperative glycemic control, again with the aforementioned guidelines in place. With this in mind, recommendations have been sought from the prescribing provider, who has cleared patient to proceed with holding the prescribed GLP-1 as per the guidelines from the ASA.   Provider recommending: no further recommendations received from the prescribing provider.  Copy of signed clearance and recommendations placed on patient's chart for inclusion in their medical record and for review by the surgical/anesthetic team on the day of her procedure.   Quentin Mulling, MSN, APRN, FNP-C, CEN [facility]  Perioperative Services Nurse Practitioner Phone: [phone] Fax: [phone] 01/24/23 4:02 PM  NOTE: This note has been prepared using Dragon dictation software. Despite my best ability to proofread, there is always the potential that unintentional transcriptional errors may still occur from this process.

## 2023-01-25 ENCOUNTER — Encounter
Admission: RE | Admit: 2023-01-25 | Discharge: 2023-01-25 | Disposition: A | Discharge status: Home / Self Care | Payer: 59 | Source: Ambulatory Visit | Attending: Surgery | Admitting: Surgery

## 2023-01-25 DIAGNOSIS — E1159 Type 2 diabetes mellitus with other circulatory complications: Secondary | ICD-10-CM | POA: Diagnosis not present

## 2023-01-25 DIAGNOSIS — I152 Hypertension secondary to endocrine disorders: Secondary | ICD-10-CM | POA: Insufficient documentation

## 2023-01-25 DIAGNOSIS — Z01818 Encounter for other preprocedural examination: Secondary | ICD-10-CM | POA: Insufficient documentation

## 2023-01-25 DIAGNOSIS — R9431 Abnormal electrocardiogram [ECG] [EKG]: Secondary | ICD-10-CM | POA: Insufficient documentation

## 2023-01-25 DIAGNOSIS — Z01812 Encounter for preprocedural laboratory examination: Secondary | ICD-10-CM

## 2023-01-25 LAB — CBC
HCT: 41.6 % (ref 36.0–46.0)
Hemoglobin: 13.7 g/dL (ref 12.0–15.0)
MCH: 28.8 pg (ref 26.0–34.0)
MCHC: 32.9 g/dL (ref 30.0–36.0)
MCV: 87.6 fL (ref 80.0–100.0)
Platelets: 312 10*3/uL (ref 150–400)
RBC: 4.75 MIL/uL (ref 3.87–5.11)
RDW: 12.9 % (ref 11.5–15.5)
WBC: 8.2 10*3/uL (ref 4.0–10.5)
nRBC: 0 % (ref 0.0–0.2)

## 2023-01-26 ENCOUNTER — Other Ambulatory Visit: Payer: Self-pay | Admitting: Family Medicine

## 2023-01-26 DIAGNOSIS — F419 Anxiety disorder, unspecified: Secondary | ICD-10-CM

## 2023-01-28 MED ORDER — ORAL CARE MOUTH RINSE: 15.0000 mL | Freq: Once | OROMUCOSAL | Status: AC

## 2023-01-28 MED ORDER — CHLORHEXIDINE GLUCONATE CLOTH 2 % EX PADS
6.0000 | MEDICATED_PAD | Freq: Once | CUTANEOUS | Status: AC
  Administered 2023-01-29: 6 via TOPICAL

## 2023-01-28 MED ORDER — CEFAZOLIN SODIUM-DEXTROSE 2-4 GM/100ML-% IV SOLN
2.0000 g | INTRAVENOUS | Status: AC
  Administered 2023-01-29: 2 g via INTRAVENOUS

## 2023-01-28 MED ORDER — ACETAMINOPHEN 500 MG PO TABS
1000.0000 mg | ORAL_TABLET | ORAL | Status: AC
  Administered 2023-01-29: 1000 mg via ORAL

## 2023-01-28 MED ORDER — CHLORHEXIDINE GLUCONATE 0.12 % MT SOLN
15.0000 mL | Freq: Once | OROMUCOSAL | Status: AC
  Administered 2023-01-29: 15 mL via OROMUCOSAL

## 2023-01-28 MED ORDER — CELECOXIB 200 MG PO CAPS
200.0000 mg | ORAL_CAPSULE | ORAL | Status: AC
  Administered 2023-01-29: 200 mg via ORAL

## 2023-01-28 MED ORDER — SODIUM CHLORIDE 0.9 % IV SOLN: INTRAVENOUS | Status: DC

## 2023-01-28 NOTE — Telephone Encounter (Signed)
Requested Prescriptions  Pending Prescriptions Disp Refills   venlafaxine XR (EFFEXOR-XR) 150 MG 24 hr capsule [Pharmacy Med Name: VENLAFAXINE HCL ER 150 MG CAP] 90 capsule 0    Sig: TAKE 1 CAPSULE BY MOUTH EVERY DAY     Psychiatry: Antidepressants - SNRI - desvenlafaxine & venlafaxine Failed - 01/26/2023  1:14 AM      Failed - Last BP in normal range    BP Readings from Last 1 Encounters:  12/31/22 (!) 146/85         Failed - Lipid Panel in normal range within the last 12 months    Cholesterol, Total  Date Value Ref Range Status  11/02/2022 138 100 - 199 mg/dL Final   LDL Chol Calc (NIH)  Date Value Ref Range Status  11/02/2022 60 0 - 99 mg/dL Final   HDL  Date Value Ref Range Status  11/02/2022 51 >39 mg/dL Final   Triglycerides  Date Value Ref Range Status  11/02/2022 159 (H) 0 - 149 mg/dL Final         Passed - Cr in normal range and within 360 days    Creatinine, Ser  Date Value Ref Range Status  11/02/2022 0.95 0.57 - 1.00 mg/dL Final         Passed - Completed PHQ-2 or PHQ-9 in the last 360 days      Passed - Valid encounter within last 6 months    Recent Outpatient Visits           2 months ago Dysuria   [facility] [place], Marzella Schlein, MD   2 months ago Encounter for annual physical exam   [place] [facility] [place], Marzella Schlein, MD   9 months ago Hypertension associated with [facility])   [place] [facility] [place], Marzella Schlein, MD   1 year ago Type 2 diabetes mellitus with other specified complication, without long-term current use of insulin [facility])   [place] [facility] [place], Marzella Schlein, MD   1 year ago Primary hypertension   [place] [facility], Marzella Schlein, MD       Future Appointments             In 2 months [place], Marzella Schlein, MD [facility], [facility]

## 2023-01-29 ENCOUNTER — Encounter
Admission: RE | Disposition: A | Discharge status: Home / Self Care | Payer: Self-pay | Source: Home / Self Care | Attending: Surgery

## 2023-01-29 ENCOUNTER — Ambulatory Visit
Admission: RE | Admit: 2023-01-29 | Discharge: 2023-01-29 | Disposition: A | Discharge status: Home / Self Care | Payer: 59 | Attending: Surgery | Admitting: Surgery

## 2023-01-29 ENCOUNTER — Ambulatory Visit
Admission: RE | Admit: 2023-01-29 | Discharge: 2023-01-29 | Disposition: A | Discharge status: Home / Self Care | Payer: 59 | Source: Ambulatory Visit | Attending: Pathology | Admitting: Pathology

## 2023-01-29 ENCOUNTER — Other Ambulatory Visit: Payer: Self-pay

## 2023-01-29 ENCOUNTER — Ambulatory Visit: Payer: 59 | Admitting: Urgent Care

## 2023-01-29 ENCOUNTER — Other Ambulatory Visit: Payer: Self-pay | Admitting: Pathology

## 2023-01-29 ENCOUNTER — Encounter: Payer: Self-pay | Admitting: Surgery

## 2023-01-29 ENCOUNTER — Ambulatory Visit
Admission: RE | Admit: 2023-01-29 | Discharge: 2023-01-29 | Disposition: A | Discharge status: Home / Self Care | Payer: 59 | Source: Ambulatory Visit | Attending: Surgery | Admitting: Surgery

## 2023-01-29 DIAGNOSIS — M795 Residual foreign body in soft tissue: Secondary | ICD-10-CM

## 2023-01-29 DIAGNOSIS — Z7985 Long-term (current) use of injectable non-insulin antidiabetic drugs: Secondary | ICD-10-CM | POA: Diagnosis not present

## 2023-01-29 DIAGNOSIS — E1159 Type 2 diabetes mellitus with other circulatory complications: Secondary | ICD-10-CM | POA: Diagnosis not present

## 2023-01-29 DIAGNOSIS — D241 Benign neoplasm of right breast: Secondary | ICD-10-CM | POA: Diagnosis present

## 2023-01-29 DIAGNOSIS — E119 Type 2 diabetes mellitus without complications: Secondary | ICD-10-CM | POA: Diagnosis not present

## 2023-01-29 DIAGNOSIS — Z01812 Encounter for preprocedural laboratory examination: Secondary | ICD-10-CM

## 2023-01-29 DIAGNOSIS — Z86000 Personal history of in-situ neoplasm of breast: Secondary | ICD-10-CM | POA: Insufficient documentation

## 2023-01-29 DIAGNOSIS — E039 Hypothyroidism, unspecified: Secondary | ICD-10-CM | POA: Insufficient documentation

## 2023-01-29 DIAGNOSIS — D369 Benign neoplasm, unspecified site: Secondary | ICD-10-CM

## 2023-01-29 DIAGNOSIS — I10 Essential (primary) hypertension: Secondary | ICD-10-CM | POA: Diagnosis not present

## 2023-01-29 DIAGNOSIS — D0511 Intraductal carcinoma in situ of right breast: Secondary | ICD-10-CM

## 2023-01-29 DIAGNOSIS — E785 Hyperlipidemia, unspecified: Secondary | ICD-10-CM | POA: Diagnosis not present

## 2023-01-29 DIAGNOSIS — R928 Other abnormal and inconclusive findings on diagnostic imaging of breast: Secondary | ICD-10-CM

## 2023-01-29 DIAGNOSIS — G4730 Sleep apnea, unspecified: Secondary | ICD-10-CM | POA: Insufficient documentation

## 2023-01-29 HISTORY — PX: BREAST LUMPECTOMY WITH RADIOFREQUENCY TAG IDENTIFICATION: SHX6884

## 2023-01-29 LAB — GLUCOSE, CAPILLARY
Glucose-Capillary: 129 mg/dL — ABNORMAL HIGH (ref 70–99)
Glucose-Capillary: 119 mg/dL — ABNORMAL HIGH (ref 70–99)

## 2023-01-29 SURGERY — BREAST LUMPECTOMY WITH RADIOFREQUENCY TAG IDENTIFICATION
Anesthesia: General | Laterality: Right

## 2023-01-29 MED ORDER — STERILE WATER FOR IRRIGATION IR SOLN
Status: DC | PRN
  Administered 2023-01-29: 1

## 2023-01-29 MED ORDER — FENTANYL CITRATE (PF) 100 MCG/2ML IJ SOLN
INTRAMUSCULAR | Status: DC | PRN
  Administered 2023-01-29 (×2): 50 ug via INTRAVENOUS

## 2023-01-29 MED ORDER — OXYCODONE HCL 5 MG PO TABS
5.0000 mg | ORAL_TABLET | Freq: Once | ORAL | Status: AC | PRN
  Administered 2023-01-29: 5 mg via ORAL

## 2023-01-29 MED ORDER — PROPOFOL 10 MG/ML IV BOLUS
INTRAVENOUS | Status: DC | PRN
  Administered 2023-01-29: 150 mg via INTRAVENOUS
  Administered 2023-01-29: 50 mg via INTRAVENOUS

## 2023-01-29 MED ORDER — MIDAZOLAM HCL 2 MG/2ML IJ SOLN
INTRAMUSCULAR | Status: AC
Start: 2023-01-29 — End: ?
  Filled 2023-01-29: qty 2

## 2023-01-29 MED ORDER — BUPIVACAINE LIPOSOME 1.3 % IJ SUSP
INTRAMUSCULAR | Status: AC
  Filled 2023-01-29: qty 20

## 2023-01-29 MED ORDER — CELECOXIB 200 MG PO CAPS
ORAL_CAPSULE | ORAL | Status: AC
  Filled 2023-01-29: qty 1

## 2023-01-29 MED ORDER — DEXAMETHASONE SODIUM PHOSPHATE 10 MG/ML IJ SOLN
INTRAMUSCULAR | Status: DC | PRN
  Administered 2023-01-29: 10 mg via INTRAVENOUS

## 2023-01-29 MED ORDER — DEXAMETHASONE SODIUM PHOSPHATE 10 MG/ML IJ SOLN
INTRAMUSCULAR | Status: AC
  Filled 2023-01-29: qty 1

## 2023-01-29 MED ORDER — LIDOCAINE HCL (PF) 2 % IJ SOLN
INTRAMUSCULAR | Status: AC
  Filled 2023-01-29: qty 5

## 2023-01-29 MED ORDER — PROPOFOL 10 MG/ML IV BOLUS
INTRAVENOUS | Status: AC
  Filled 2023-01-29: qty 20

## 2023-01-29 MED ORDER — LACTATED RINGERS IV SOLN: INTRAVENOUS | Status: DC | PRN

## 2023-01-29 MED ORDER — ONDANSETRON HCL 4 MG/2ML IJ SOLN
INTRAMUSCULAR | Status: AC
  Filled 2023-01-29: qty 2

## 2023-01-29 MED ORDER — PHENYLEPHRINE 80 MCG/ML (10ML) SYRINGE FOR IV PUSH (FOR BLOOD PRESSURE SUPPORT)
PREFILLED_SYRINGE | INTRAVENOUS | Status: DC | PRN
  Administered 2023-01-29: 80 ug via INTRAVENOUS
  Administered 2023-01-29: 160 ug via INTRAVENOUS
  Administered 2023-01-29: 80 ug via INTRAVENOUS
  Administered 2023-01-29: 160 ug via INTRAVENOUS
  Administered 2023-01-29 (×2): 80 ug via INTRAVENOUS

## 2023-01-29 MED ORDER — FENTANYL CITRATE (PF) 100 MCG/2ML IJ SOLN
INTRAMUSCULAR | Status: AC
  Filled 2023-01-29: qty 2

## 2023-01-29 MED ORDER — LIDOCAINE HCL (CARDIAC) PF 100 MG/5ML IV SOSY
PREFILLED_SYRINGE | INTRAVENOUS | Status: DC | PRN
  Administered 2023-01-29: 100 mg via INTRAVENOUS

## 2023-01-29 MED ORDER — OXYCODONE HCL 5 MG PO TABS
ORAL_TABLET | ORAL | Status: AC
  Filled 2023-01-29: qty 1

## 2023-01-29 MED ORDER — CEFAZOLIN SODIUM-DEXTROSE 2-4 GM/100ML-% IV SOLN
INTRAVENOUS | Status: AC
  Filled 2023-01-29: qty 100

## 2023-01-29 MED ORDER — PHENYLEPHRINE 80 MCG/ML (10ML) SYRINGE FOR IV PUSH (FOR BLOOD PRESSURE SUPPORT)
PREFILLED_SYRINGE | INTRAVENOUS | Status: AC
  Filled 2023-01-29: qty 10

## 2023-01-29 MED ORDER — FENTANYL CITRATE (PF) 100 MCG/2ML IJ SOLN
25.0000 ug | INTRAMUSCULAR | Status: DC | PRN
Start: 2023-01-29 — End: 2023-01-29
  Administered 2023-01-29 (×2): 25 ug via INTRAVENOUS
  Administered 2023-01-29: 50 ug via INTRAVENOUS
  Administered 2023-01-29 (×2): 25 ug via INTRAVENOUS

## 2023-01-29 MED ORDER — ONDANSETRON HCL 4 MG/2ML IJ SOLN
INTRAMUSCULAR | Status: DC | PRN
  Administered 2023-01-29: 4 mg via INTRAVENOUS

## 2023-01-29 MED ORDER — PHENYLEPHRINE HCL-NACL 20-0.9 MG/250ML-% IV SOLN
INTRAVENOUS | Status: DC | PRN
  Administered 2023-01-29: 40 ug/min via INTRAVENOUS

## 2023-01-29 MED ORDER — OXYCODONE HCL 5 MG/5ML PO SOLN
5.0000 mg | Freq: Once | ORAL | Status: AC | PRN
Start: 2023-01-29 — End: 2023-01-29

## 2023-01-29 MED ORDER — BUPIVACAINE-EPINEPHRINE (PF) 0.25% -1:200000 IJ SOLN
INTRAMUSCULAR | Status: AC
  Filled 2023-01-29: qty 30

## 2023-01-29 MED ORDER — MIDAZOLAM HCL 2 MG/2ML IJ SOLN
INTRAMUSCULAR | Status: DC | PRN
  Administered 2023-01-29: 2 mg via INTRAVENOUS

## 2023-01-29 MED ORDER — HYDROCODONE-ACETAMINOPHEN 5-325 MG PO TABS: 1.0000 | ORAL_TABLET | Freq: Four times a day (QID) | ORAL | 0 refills | Status: DC | PRN

## 2023-01-29 MED ORDER — BUPIVACAINE-EPINEPHRINE 0.25% -1:200000 IJ SOLN
INTRAMUSCULAR | Status: DC | PRN
  Administered 2023-01-29: 30 mL

## 2023-01-29 MED ORDER — CHLORHEXIDINE GLUCONATE 0.12 % MT SOLN
OROMUCOSAL | Status: AC
  Filled 2023-01-29: qty 15

## 2023-01-29 MED ORDER — EPHEDRINE SULFATE-NACL 50-0.9 MG/10ML-% IV SOSY
PREFILLED_SYRINGE | INTRAVENOUS | Status: DC | PRN
  Administered 2023-01-29: 5 mg via INTRAVENOUS

## 2023-01-29 MED ORDER — BUPIVACAINE LIPOSOME 1.3 % IJ SUSP
INTRAMUSCULAR | Status: DC | PRN
  Administered 2023-01-29: 20 mL

## 2023-01-29 MED ORDER — OXYCODONE HCL 5 MG PO TABS
5.0000 mg | ORAL_TABLET | Freq: Once | ORAL | Status: AC
  Administered 2023-01-29: 5 mg via ORAL

## 2023-01-29 MED ORDER — ACETAMINOPHEN 500 MG PO TABS
ORAL_TABLET | ORAL | Status: AC
  Filled 2023-01-29: qty 2

## 2023-01-29 SURGICAL SUPPLY — 37 items
ADH SKN CLS APL DERMABOND .7 (GAUZE/BANDAGES/DRESSINGS) ×1
APL PRP FLT STRL LF DISP 70% (MISCELLANEOUS) ×1
APPLICATOR CHLORAPREP 10 TEAL (MISCELLANEOUS) ×1 IMPLANT
APPLIER CLIP 9.375 SM OPEN (CLIP)
APR CLP SM 9.3 20 MLT OPN (CLIP)
CLIP APPLIE 9.375 SM OPEN (CLIP) IMPLANT
DERMABOND ADVANCED .7 DNX12 (GAUZE/BANDAGES/DRESSINGS) ×1 IMPLANT
DEVICE DUBIN SPECIMEN MAMMOGRA (MISCELLANEOUS) ×1 IMPLANT
DRAPE CHEST BREAST 77X106 FENE (MISCELLANEOUS) ×1 IMPLANT
ELECT CAUTERY BLADE 6.4 (BLADE) ×1 IMPLANT
ELECT REM PT RETURN 9FT ADLT (ELECTROSURGICAL) ×1
ELECTRODE REM PT RTRN 9FT ADLT (ELECTROSURGICAL) ×1 IMPLANT
GLOVE BIO SURGEON STRL SZ7 (GLOVE) ×1 IMPLANT
GOWN STRL REUS W/ TWL LRG LVL3 (GOWN DISPOSABLE) ×2 IMPLANT
GOWN STRL REUS W/TWL LRG LVL3 (GOWN DISPOSABLE) ×3
KIT MARKER MARGIN INK (KITS) IMPLANT
KIT TURNOVER KIT A (KITS) ×1 IMPLANT
MANIFOLD NEPTUNE II (INSTRUMENTS) ×1 IMPLANT
MARGIN MAP 10MM (MISCELLANEOUS) IMPLANT
MARKER MARGIN CORRECT CLIP (MARKER) IMPLANT
NDL HYPO 22X1.5 SAFETY MO (MISCELLANEOUS) ×1 IMPLANT
NEEDLE HYPO 22X1.5 SAFETY MO (MISCELLANEOUS) ×1 IMPLANT
PACK BASIN MINOR ARMC (MISCELLANEOUS) ×1 IMPLANT
SHEATH BREAST BIOPSY SKIN MKR (SHEATH) ×1 IMPLANT
SPONGE T-LAP 18X18 ~~LOC~~+RFID (SPONGE) ×1 IMPLANT
SUT MNCRL 4-0 (SUTURE) ×2
SUT MNCRL 4-0 27XMFL (SUTURE) ×2
SUT SILK 2 0 SH (SUTURE) IMPLANT
SUT VIC AB 2-0 SH 27 (SUTURE) ×4
SUT VIC AB 2-0 SH 27XBRD (SUTURE) ×2 IMPLANT
SUT VIC AB 3-0 SH 27 (SUTURE) ×2
SUT VIC AB 3-0 SH 27X BRD (SUTURE) ×2 IMPLANT
SUTURE MNCRL 4-0 27XMF (SUTURE) ×2 IMPLANT
TRAP FLUID SMOKE EVACUATOR (MISCELLANEOUS) ×1 IMPLANT
TRAP NEPTUNE SPECIMEN COLLECT (MISCELLANEOUS) ×1 IMPLANT
WATER STERILE IRR 1000ML POUR (IV SOLUTION) ×1 IMPLANT
WATER STERILE IRR 500ML POUR (IV SOLUTION) ×1 IMPLANT

## 2023-01-29 NOTE — Anesthesia Procedure Notes (Signed)
Procedure Name: LMA Insertion Date/Time: 01/29/2023 9:22 AM  Performed by: Lily Lovings, CRNAPre-anesthesia Checklist: Patient identified, Patient being monitored, Timeout performed, Emergency Drugs available and Suction available Patient Re-evaluated:Patient Re-evaluated prior to induction Oxygen Delivery Method: Circle system utilized Preoxygenation: Pre-oxygenation with 100% oxygen Induction Type: IV induction Ventilation: Mask ventilation without difficulty LMA: LMA inserted LMA Size: 4.0 Tube type: Oral Number of attempts: 1 Placement Confirmation: positive ETCO2 and breath sounds checked- equal and bilateral Tube secured with: Tape Dental Injury: Teeth and Oropharynx as per pre-operative assessment  Comments: Placed by Earleen Reaper, SRNA with supervison from CRNA and MDA

## 2023-01-29 NOTE — Interval H&P Note (Signed)
History and Physical Interval Note:  01/29/2023 8:57 AM  Virginia Martin  has presented today for surgery, with the diagnosis of breast papilloma.  The various methods of treatment have been discussed with the patient and family. After consideration of risks, benefits and other options for treatment, the patient has consented to  Procedure(s): BREAST LUMPECTOMY WITH RADIOFREQUENCY TAG IDENTIFICATION, SAVI scout tag (Right) as a surgical intervention.  The patient's history has been reviewed, patient examined, no change in status, stable for surgery.  I have reviewed the patient's chart and labs.  Questions were answered to the patient's satisfaction.     Virginia Martin

## 2023-01-29 NOTE — Transfer of Care (Signed)
Immediate Anesthesia Transfer of Care Note  Patient: Virginia Martin  Procedure(s) Performed: BREAST LUMPECTOMY WITH RADIOFREQUENCY TAG IDENTIFICATION, SAVI scout tag (Right)  Patient Location: PACU  Anesthesia Type:General  Level of Consciousness: drowsy and patient cooperative  Airway & Oxygen Therapy: Patient Spontanous Breathing and Patient connected to nasal cannula oxygen  Post-op Assessment: Report given to RN and Post -op Vital signs reviewed and stable  Post vital signs: Reviewed and stable  Last Vitals:  Vitals Value Taken Time  BP 131/68 01/29/23 1100  Temp    Pulse 87 01/29/23 1101  Resp 14 01/29/23 1101  SpO2 97 % 01/29/23 1101  Vitals shown include unfiled device data.  Last Pain:  Vitals:   01/29/23 0759  TempSrc: Temporal  PainSc: 0-No pain      Patients Stated Pain Goal: 0 (01/29/23 0759)  Complications: No notable events documented.

## 2023-01-29 NOTE — Anesthesia Postprocedure Evaluation (Signed)
Anesthesia Post Note  Patient: Virginia Martin  Procedure(s) Performed: BREAST LUMPECTOMY WITH RADIOFREQUENCY TAG IDENTIFICATION, SAVI scout tag (Right)  Patient location during evaluation: PACU Anesthesia Type: General Level of consciousness: awake and alert Pain management: pain level controlled Vital Signs Assessment: post-procedure vital signs reviewed and stable Respiratory status: spontaneous breathing, nonlabored ventilation, respiratory function stable and patient connected to nasal cannula oxygen Cardiovascular status: blood pressure returned to baseline and stable Postop Assessment: no apparent nausea or vomiting Anesthetic complications: no   No notable events documented.   Last Vitals:  Vitals:   01/29/23 1145 01/29/23 1204  BP: 137/69 139/70  Pulse: 86 91  Resp: 14 16  Temp:  (!) 36.1 C  SpO2: 100% 97%    Last Pain:  Vitals:   01/29/23 1204  TempSrc:   PainSc: 5                  Cleda Mccreedy Marlo Arriola

## 2023-01-29 NOTE — Anesthesia Preprocedure Evaluation (Signed)
Anesthesia Evaluation  Patient identified by MRN, date of birth, ID band Patient awake    Reviewed: Allergy & Precautions, NPO status , Patient's Chart, lab work & pertinent test results  History of Anesthesia Complications Negative for: history of anesthetic complications  Airway Mallampati: III  TM Distance: <3 FB Neck ROM: full    Dental  (+) Chipped   Pulmonary neg shortness of breath, sleep apnea    Pulmonary exam normal        Cardiovascular Exercise Tolerance: Good hypertension, (-) angina (-) CAD and (-) DOE Normal cardiovascular exam     Neuro/Psych  PSYCHIATRIC DISORDERS      negative neurological ROS     GI/Hepatic negative GI ROS, Neg liver ROS,neg GERD  ,,  Endo/Other  diabetes, Type 2Hypothyroidism    Renal/GU      Musculoskeletal   Abdominal   Peds  Hematology negative hematology ROS (+)   Anesthesia Other Findings Past Medical History: No date: Cancer (HCC) No date: Depression 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 No date: Family history of adverse reaction to anesthesia     Comment:  mother has post op N/V No date: Hyperlipidemia No date: Hypertension associated with diabetes (HCC) No date: Hypothyroidism 2018: Intraductal papilloma of breast, right No date: Thyroid disease No date: Type 2 diabetes mellitus (HCC)  Past Surgical History: 04/09/2000: ABDOMINAL HYSTERECTOMY 04/09/2000: APPENDECTOMY 04/09/2004: BREAST BIOPSY; Left No date: BREAST BIOPSY; Right     Comment:  [place] Core Bx 10:00 Ribbon Clip- path pending 12/19/2022: BREAST BIOPSY; Right     Comment:  [place] Core Bx, 9:00 Coil Clip- path pending 12/19/2022: BREAST BIOPSY; Right     Comment:  [place] RT BREAST BX W LOC DEV 1ST LESION IMG BX SPEC [place]               GUIDE 12/19/2022 [facility] 12/19/2022: BREAST BIOPSY; Right     Comment:  [place] RT BREAST BX W LOC DEV EA ADD LESION IMG BX SPEC [place]               GUIDE  12/19/2022 [facility] 01/09/2023: BREAST BIOPSY; Right     Comment:  MM RT RADIO FREQUENCY TAG LOC MAMMO GUIDE 01/09/2023               [facility] 2018: BREAST EXCISIONAL BIOPSY; Right     Comment:  benign 2006: BREAST LUMPECTOMY; Left     Comment:  DCIS 1992: HARDWARE REMOVAL; Left     Comment:  wrist plate and screws removed 07/25/2015: INCISION AND DRAINAGE ABSCESS; N/A     Comment:  Procedure: INCISION AND DRAINAGE ABSCESS / GLUTEAL;                Surgeon: Ricarda Frame, MD;  Location: [facility] ORS;                Service: General;  Laterality: N/A; 1991: WRIST FRACTURE SURGERY; Left     Comment:  plate and screws  BMI    Body Mass Index: 33.46 kg/m      Reproductive/Obstetrics negative OB ROS                             Anesthesia Physical Anesthesia Plan  ASA: 3  Anesthesia Plan: General LMA   Post-op Pain Management:    Induction: Intravenous  PONV Risk Score and Plan: Dexamethasone, Ondansetron, Midazolam and Treatment may vary due to age or medical condition  Airway Management Planned: LMA  Additional Equipment:   Intra-op Plan:   Post-operative Plan: Extubation in OR  Informed Consent: I have reviewed the patients History and Physical, chart, labs and discussed the procedure including the risks, benefits and alternatives for the proposed anesthesia with the patient or authorized representative who has indicated his/her understanding and acceptance.     Dental Advisory Given  Plan Discussed with: Anesthesiologist, CRNA and Surgeon  Anesthesia Plan Comments: (Patient consented for risks of anesthesia including but not limited to:  - adverse reactions to medications - damage to eyes, teeth, lips or other oral mucosa - nerve damage due to positioning  - sore throat or hoarseness - Damage to heart, brain, nerves, lungs, other parts of body or loss of life  Patient voiced understanding and assent.)        Anesthesia Quick Evaluation

## 2023-01-29 NOTE — Op Note (Signed)
Pre-operative Diagnosis: Papilloma of Right breast    Post-operative Diagnosis: Same   Surgeon: Sterling Big,  MD FACS  Anesthesia: GETA  Procedure: Right Partial mastectomy, Savi scout guided Complex closure with tissue rearrangements measuring 95 cm2    Findings: Clip and lesion within xray specimen   Estimated Blood Loss: Minimal         Drains: None         Specimens: partial mastectomy with labels,        Complications: none                 Condition: Stable  Procedure Details  The patient was seen again in the Holding Room. The benefits, complications, treatment options, and expected outcomes were discussed with the patient. The risks of bleeding, infection, recurrence of symptoms, failure to resolve symptoms, hematoma, seroma, open wound, cosmetic deformity, and the need for further surgery were discussed.  The patient was taken to Operating Room, identified  and the procedure verified.  A Time Out was held and the above information confirmed.  Prior to the induction of general anesthesia, antibiotic prophylaxis was administered. VTE prophylaxis was in place. Appropriate anesthesia was then administered and tolerated well. I used the savi scout probe to localize the area of maximal cadence and Marked the area.  The chest was prepped with Chloraprep and draped in the sterile fashion. The patient was positioned in the supine position.   Using the hand-held probe an area of high counts was identified in the Right breast and marked . An incision was made in a crescent moon shaped fashion after identifying again the point of maximal cadence. Circumferential flaps performed with cautery and breast tissue was elevated after taking a figure of eight stitch of the breast tissue. Partial mastectomy with adequate margins was performed using cautery. Maximal cadance changed after the incision and creating flaps. The specimen was more generous on the medial portion. The specimen was  label, painted, we obtained faxitron images confirming appropriate excision, specimen was sent to permanent pathology . Tissue advancement flaps were performed to decrease the volume deficit in the area of the resection.  Please note that the total void area was 10 x 9.5 cm equals 95 cm.  The chest wall flaps were created by incising the breast parenchyma from the pectoralis fascia in a circumferential method.  The breast parenchyma was then reapproximated in a deep to superficial fashion using interrupted 2-0 Vicryl sutures.  Please note that I placed 2 deep layers of 2-0 Vicryl's. Once assuring that hemostasis was adequate and checked multiple times the wound was closed with 4-0 subcuticular Monocryl sutures. Dermabond was placed  Patient was taken to the recovery room in stable condition.   Sterling Big , MD, FACS

## 2023-01-29 NOTE — Discharge Instructions (Addendum)
Lumpectomy, Care After The following information offers guidance on how to care for yourself after your procedure. Your health care provider may also give you more specific instructions. If you have problems or questions, contact your health care provider. What can I expect after the procedure? After the procedure, it is common to have: Some pain or redness at the incision site. Breast swelling. Breast tenderness. Stiffness in your arm or shoulder. A change in the shape and feel of your breast. Scar tissue that feels hard to the touch in the area where the lump was removed. Follow these instructions at home: Medicines Take over-the-counter and prescription medicines only as told by your health care provider. If you were prescribed an antibiotic, take it as told by your health care provider. Do not stop taking the antibiotic even if you start to feel better. Ask your health care provider if the medicine prescribed to you: Requires you to avoid driving or using machinery. Can cause constipation. You may need to take these actions to prevent or treat constipation: Drink enough fluid to keep your urine pale yellow. Take over-the-counter or prescription medicines. Eat foods that are high in fiber, such as beans, whole grains, and fresh fruits and vegetables. Limit foods that are high in fat and processed sugars, such as fried or sweet foods. Incision care     Follow instructions from your health care provider about how to take care of your incision. Make sure you: Wash your hands with soap and water for at least 20 seconds before and after you change your bandage (dressing). If soap and water are not available, use hand sanitizer. Change your dressing as told by your health care provider. Leave stitches (sutures), skin glue, or adhesive strips in place. These skin closures may need to stay in place for 2 weeks or longer. If adhesive strip edges start to loosen and curl up, you may trim the  loose edges. Do not remove adhesive strips completely unless your health care provider tells you to do that. Check your incision area every day for signs of infection. Check for: More redness, swelling, or pain. Fluid or blood. Warmth. Pus or a bad smell. Keep your dressing clean and dry. If you were sent home with a surgical drain in place, follow instructions from your health care provider about emptying it. Bathing Do not take baths, swim, or use a hot tub until your health care provider approves. Ask your health care provider if you may take showers. You may only be allowed to take sponge baths. Activity Rest as told by your health care provider. Do not sit for a long time without moving. Get up to take short walks every 1-2 hours. This will improve blood flow and breathing. Ask for help if you feel weak or unsteady. Be careful to avoid any activities that could cause an injury to your arm on the side of your surgery. Do not lift anything that is heavier than 10 lb (4.5 kg), or the limit that you are told, until your health care provider says that it is safe. Avoid lifting with the arm that is on the side of your surgery. Do not carry heavy objects on your shoulder on the side of your surgery. Do exercises to keep your shoulder and arm from getting stiff and swollen. Talk with your health care provider about which exercises are safe for you. Return to your normal activities as told by your health care provider. Ask your health care provider what activities  are safe for you. General instructions Wear a supportive bra as told by your health care provider. Raise (elevate) your arm above the level of your heart while you are sitting or lying down. Do not wear tight jewelry on your arm, wrist, or fingers on the side of your surgery. Wear compression stockings as told by your health care provider. These stockings help to prevent blood clots and reduce swelling in your legs. If you had any lymph  nodes removed during your procedure, be sure to tell all of your health care providers. It is important to share this information before you have certain procedures, such as blood tests or blood pressure measurements. Keep all follow-up visits. You may need to be screened for extra fluid around the lymph nodes and swelling in the breast and arm (lymphedema). Contact a health care provider if: You develop a rash. You have a fever. Your pain worsens or pain medicine is not working. You have swelling, weakness, or numbness in your arm that does not improve after a few weeks. You have new swelling in your breast. You have any of these signs of infection: More redness, swelling, or pain in your incision area. Fluid or blood coming from your incision. Warmth coming from the incision area. Pus or a bad smell coming from your incision. Get help right away if: You have very bad pain in your breast or arm. You have swelling in your legs or arms. You have redness, warmth, or pain in your leg or arm. You have chest pain. You have difficulty breathing. These symptoms may be an emergency. Get help right away. Call 911. Do not wait to see if the symptoms will go away. Do not drive yourself to the hospital. Summary After the procedure, it is common to have breast tenderness, swelling in your breast, and stiffness in your arm and shoulder. Follow instructions from your health care provider about how to take care of your incision. Do not lift anything that is heavier than 10 lb (4.5 kg), or the limit that you are told, until your health care provider says that it is safe. Avoid lifting with the arm that is on the side of your surgery. If you had any lymph nodes removed during your procedure, be sure to tell all of your health care providers. This information is not intended to replace advice given to you by your health care provider. Make sure you discuss any questions you have with your health care  provider. Document Revised: 06/04/2021 Document Reviewed: 06/04/2021 Elsevier Patient Education  2024 [employer]  AMBULATORY SURGERY  DISCHARGE INSTRUCTIONS   The drugs that you were given will stay in your system until tomorrow so for the next 24 hours you should not:  Drive an automobile Make any legal decisions Drink any alcoholic beverage   You may resume regular meals tomorrow.  Today it is better to start with liquids and gradually work up to solid foods.  You may eat anything you prefer, but it is better to start with liquids, then soup and crackers, and gradually work up to solid foods.   Please notify your doctor immediately if you have any unusual bleeding, trouble breathing, redness and pain at the surgery site, drainage, fever, or pain not relieved by medication.   Your post-operative visit with Dr.  is: Date:                        Time:    Please call to schedule your post-operative visit.  Additional Instructions: PLEASE LEAVE EXPAREL (TEAL) ARMBAND ON FOR 4 DAYS

## 2023-01-30 ENCOUNTER — Encounter: Payer: Self-pay | Admitting: Surgery

## 2023-01-31 ENCOUNTER — Encounter: Payer: Self-pay | Admitting: Surgery

## 2023-01-31 LAB — SURGICAL PATHOLOGY

## 2023-02-01 ENCOUNTER — Telehealth: Payer: Self-pay

## 2023-02-01 NOTE — Telephone Encounter (Signed)
-----   Message from [facility] [place] sent at 02/01/2023 10:33 AM EDT ----- Please let her know pathology was benign, no cancer or anything to worry about, congratulations. ----- Message ----- From: Interface, Lab In Three [facility] One Sent: 01/31/2023   6:39 PM EDT To: Leafy Ro, MD

## 2023-02-01 NOTE — Telephone Encounter (Signed)
Notified patient as instructed, patient pleased. Discussed follow-up appointments, patient agrees  

## 2023-02-13 ENCOUNTER — Ambulatory Visit (INDEPENDENT_AMBULATORY_CARE_PROVIDER_SITE_OTHER): Payer: 59 | Admitting: Surgery

## 2023-02-13 ENCOUNTER — Encounter: Payer: Self-pay | Admitting: Surgery

## 2023-02-13 VITALS — BP 128/81 | HR 76 | Temp 98.3°F | Ht 64.0 in | Wt 203.6 lb

## 2023-02-13 DIAGNOSIS — D0511 Intraductal carcinoma in situ of right breast: Secondary | ICD-10-CM

## 2023-02-13 DIAGNOSIS — Z09 Encounter for follow-up examination after completed treatment for conditions other than malignant neoplasm: Secondary | ICD-10-CM

## 2023-02-13 NOTE — Patient Instructions (Addendum)
If you have any concerns or questions, please feel free to call our office. See follow up appointment.   Seroma A seroma is a collection of fluid on the body that looks like swelling or a mass. Seromas form where tissue has been injured or cut. Seromas vary in size. Some are small and painless. Others may grow large and cause pain or discomfort. Many seromas go away on their own as the fluid is naturally absorbed by the body, and some seromas need to be drained. What are the causes? Seromas form because tissue has been damaged or removed. This tissue damage may happen during surgery or because of an injury or trauma. When tissue is damaged or removed, empty space is created. The body's defense system (immune system) causes fluid to enter the empty space and form a seroma. What are the signs or symptoms? Symptoms of this condition include: Swelling at the site of a surgical incision or an injury. Drainage of clear fluid at the surgery or injury site. Discomfort or pain. How is this diagnosed? This condition is diagnosed based on: Your symptoms. Your medical history. A physical exam. During the exam, your health care provider will press on the seroma. You may also have tests, such as: Blood tests. An ultrasound, a CT scan, or other imaging tests. How is this treated? Some seromas go away on their own. Your health care provider may watch the seroma to make sure it does not cause any problems. Seromas that do not go away on their own may be treated. Treatment may include: Using a needle to drain the fluid from the seroma (needle aspiration). Putting in a small, thin tube (catheter) to drain the fluid. Putting on a bandage (dressing), such as an elastic bandage or binder. Taking antibiotics, if the seroma gets infected. In rare cases, surgery may be done to remove the seroma and repair the area. Follow these instructions at home:  If you were prescribed antibiotics, take them as told by your  health care provider. Do not stop using the antibiotic even if you start to feel better. Take over-the-counter and prescription medicines only as told by your health care provider. Return to your normal activities as told by your health care provider. Ask your health care provider what activities are safe for you. Check your seroma every day for signs of infection. Check for: Redness or pain. More swelling. More fluid. Warmth. Pus or a bad smell. Keep all follow-up visits. Your health care provider needs to check that your seroma is healing. Contact a health care provider if: You have a fever. You have redness or pain at the site of your seroma. Your seroma is more swollen or is getting bigger. You have more fluid coming from your seroma. Your seroma feels warm to the touch. You have pus or a bad smell coming from your seroma. Get help right away if: You have a fever along with severe pain, redness at the site, or chills. You feel confused or have trouble staying awake. You feel like your heart is beating very fast. You feel short of breath or are breathing fast. You have cool, clammy, or sweaty skin. Summary Seromas can form because of injury or surgery on tissue. Seromas can cause swelling, drainage of clear fluid, and discomfort or pain at the surgery or injury site. Some seromas go away on their own. Other seromas may need to be drained. Check your seroma every day for signs of infection. Signs include redness, pain, more  swelling, more fluid, warmth, pus, or a bad smell. This information is not intended to replace advice given to you by your health care provider. Make sure you discuss any questions you have with your health care provider. Document Revised: 06/12/2021 Document Reviewed: 06/12/2021 Elsevier Patient Education  2024 [employer].

## 2023-02-13 NOTE — Progress Notes (Signed)
Virginia Martin is 2 weeks out following a right lumpectomy for benign disease.  Pathology consistent with intraductal papilloma.  Last night she started having some drainage from the right breast.  No fevers no chills.  Drainage seems to be clear and serosanguineous.  No other concerns.  Pain is being good   Physical exam: Chaperone present.  Vital signs stable afebrile. Right breast with appropriate induration and may be a pinpoint opening currently there is no evidence of purulence infection.  There is also no evidence of active drainage.  A/P overall doing well from lumpectomy in the right side.  Likely drain Ismol seroma.  I do not think that there is evidence to suggest infection and there is no indication from antibiotics.  We will continue to follow her

## 2023-02-18 ENCOUNTER — Ambulatory Visit: Payer: 59 | Admitting: Surgery

## 2023-02-26 ENCOUNTER — Ambulatory Visit: Payer: Self-pay

## 2023-02-26 NOTE — Telephone Encounter (Signed)
  Chief Complaint: Surgery site has reopened on breast Symptoms: leaking a fair amount of darker red blood Frequency: Sx was 10/22 Pertinent Negatives: Patient denies fever, warmth Disposition: [] ED /[] Urgent Care (no appt availability in office) / [x] Appointment(In office/virtual)/ []  [place] [facility]/ [] Home Care/ [] Refused Recommended Disposition /[] [place] [facility]/ []  Follow-up with PCP Additional Notes: Pt had breast lumpectomy 01/29/2023 Wound has opened and a lot of fluid drained at first. Fluid is continuing to drain and wound is not closing. Pt contacted surgeons office, who is unsble to see her until next week. Pt  feels that something is wrong with sx site and wants provider to see it.   Reason for Disposition  [1] After 14 days AND [2] wound isn't healed  Answer Assessment - Initial Assessment Questions 1. LOCATION: "Where is the wound located?"      Breast 2. WOUND APPEARANCE: "What does the wound look like?"      Open from recent sx 3. SIZE: If redness is present, ask: "What is the size of the red area?" (Inches, centimeters, or compare to size of a coin)      no 4. SPREAD: "What's changed in the last day?"  "Do you see any red streaks coming from the wound?"     no 5. ONSET: "When did it start to look infected?"      Does not 6. MECHANISM: "How did the wound start, what was the cause?"     Surgery  8. FEVER: "Do you have a fever?" If Yes, ask: "What is your temperature, how was it measured, and when did it start?"     no 9. OTHER SYMPTOMS: "Do you have any other symptoms?" (e.g., shaking chills, weakness, rash elsewhere on body)     no  Protocols used: Wound Infection Suspected-A-AH

## 2023-02-27 ENCOUNTER — Ambulatory Visit (INDEPENDENT_AMBULATORY_CARE_PROVIDER_SITE_OTHER): Payer: 59 | Admitting: Family Medicine

## 2023-02-27 ENCOUNTER — Encounter: Payer: Self-pay | Admitting: Family Medicine

## 2023-02-27 ENCOUNTER — Ambulatory Visit: Payer: 59 | Admitting: Family Medicine

## 2023-02-27 VITALS — BP 131/78 | HR 77 | Resp 16 | Ht 64.0 in | Wt 205.0 lb

## 2023-02-27 DIAGNOSIS — Z23 Encounter for immunization: Secondary | ICD-10-CM

## 2023-02-27 DIAGNOSIS — T8131XA Disruption of external operation (surgical) wound, not elsewhere classified, initial encounter: Secondary | ICD-10-CM | POA: Diagnosis not present

## 2023-02-27 NOTE — Progress Notes (Signed)
Established patient visit   Patient: Virginia Martin   DOB: [birth year]   57 y.o. Female  MRN: [id number] Visit Date: 02/27/2023  Today's healthcare provider: Mila Merry, MD   Chief Complaint  Patient presents with   Follow-up    Surgical site opened and has visit with surgery until Monday.   Subjective    Discussed the use of AI scribe software for clinical note transcription with the patient, who gave verbal consent to proceed.  History of Present Illness   The patient, with a history of recent lumpectomy 4 weeks ago by Dr. Everlene Farrier, presents with concerns about her surgical wound. She was seen by Dr. Nadeen Landau two weeks ago with seroma and open wound and she states that despite reassurances from her surgeon that this was normal and instructions to keep it dry, the patient remains unsatisfied with the wound's condition. She reports had similar problem with surgical wound years about 16 years ago which became infected.   The wound has been draining a sticky substance, which has decreased in volume but increased in thickness since her last follow-up. The patient reports intermittent pain within the breast and a sensation of something poking her from the inside. Prior to the onset of drainage, the patient noticed the skin around the incision turning black.  The patient has been managing the wound by keeping it dry and using steri-strips to cover the three different holes that have formed. She denies any systemic symptoms such as fever, chills, or sweats.       Medications: Outpatient Medications Prior to Visit  Medication Sig   amLODipine (NORVASC) 10 MG tablet TAKE 1 TABLET BY MOUTH EVERY DAY (Patient taking differently: Take 10 mg by mouth at bedtime.)   ibuprofen (ADVIL) 200 MG tablet Take 600 mg by mouth every 6 (six) hours as needed.   levothyroxine (SYNTHROID) 75 MCG tablet Take 1 tablet (75 mcg total) by mouth daily. (Patient taking differently: Take 75 mcg by mouth at bedtime.)    rosuvastatin (CRESTOR) 5 MG tablet TAKE 1 TABLET (5 MG TOTAL) BY MOUTH DAILY. (Patient taking differently: Take 5 mg by mouth at bedtime.)   Semaglutide, 1 MG/DOSE, (OZEMPIC, 1 MG/DOSE,) 4 MG/3ML SOPN INJECT 1 MG ONCE A WEEK AS DIRECTED (Patient taking differently: Inject 1 mg into the skin once a week. Sunday)   venlafaxine XR (EFFEXOR-XR) 150 MG 24 hr capsule TAKE 1 CAPSULE BY MOUTH EVERY DAY   venlafaxine XR (EFFEXOR-XR) 75 MG 24 hr capsule TAKE 1 CAPSULE BY MOUTH DAILY WITH BREAKFAST. TAKE WITH THE 150 DOSE TO MAKE 225MG  DAILY. (Patient taking differently: Take 75 mg by mouth at bedtime.)   No facility-administered medications prior to visit.   Review of Systems     Objective    BP 131/78 (BP Location: Left Arm, Patient Position: Sitting, Cuff Size: Large)   Pulse 77   Resp 16   Ht 5\' 4"  (1.626 m)   Wt 205 lb (93 kg)   BMI 35.19 kg/m   Physical Exam   Surgical wound right upper breast with about 5mm wide opening surrounded on either side by steri-strips that were applied by patient. Some tenderness few cm above wound. No erythema. Scant yellow discharge.    Assessment & Plan        Postoperative wound complication Patient reports wound dehiscence and drainage following surgery 4 weeks ago, and diagnosed with seroma by her surgeon 2 weeks ago. Next surgery appt is in 5 days.   -  Obtain wound culture to rule out infection. -Advise patient to continue keeping the wound dry and covered. May leave patient applied steri-strips adjacent to opening to prevent it from widening.  -consider abx if culture is positive. Follow up surgery in 5 days as already scheduled.    No follow-ups on file.      Mila Merry, MD  [facility] [phone] (phone) [phone] (fax)  [facility]

## 2023-03-03 LAB — AEROBIC CULTURE
AER RESULT:: NO GROWTH
MICRO NUMBER:: 15756705
SPECIMEN QUALITY:: ADEQUATE

## 2023-03-04 ENCOUNTER — Ambulatory Visit (INDEPENDENT_AMBULATORY_CARE_PROVIDER_SITE_OTHER): Payer: 59 | Admitting: Surgery

## 2023-03-04 ENCOUNTER — Encounter: Payer: Self-pay | Admitting: Surgery

## 2023-03-04 VITALS — BP 111/70 | HR 81 | Temp 98.0°F | Ht 64.0 in | Wt 203.0 lb

## 2023-03-04 DIAGNOSIS — Z09 Encounter for follow-up examination after completed treatment for conditions other than malignant neoplasm: Secondary | ICD-10-CM

## 2023-03-04 DIAGNOSIS — D0511 Intraductal carcinoma in situ of right breast: Secondary | ICD-10-CM

## 2023-03-04 NOTE — Patient Instructions (Signed)
   May try using a foam dressing.  Alternative you may use Telfa, gauze, and a Tegaderm covering.   Change this everyday.  Follow up here in 2 weeks.

## 2023-03-05 ENCOUNTER — Encounter: Payer: Self-pay | Admitting: Surgery

## 2023-03-05 NOTE — Progress Notes (Signed)
Outpatient Surgical Follow Up  03/05/2023  Virginia Martin is an 57 y.o. female.   Chief Complaint  Patient presents with   Routine Post Op    HPI: Virginia Martin is 4 weeks out following a right lumpectomy for benign disease. Pathology consistent with intraductal papilloma. She now has some serous draiange, it was cultured by PCP, no growth.  No fevers no chills. Drainage seems to be clear  No other concerns.   Past Medical History:  Diagnosis Date   Cancer (HCC)    Depression    Ductal carcinoma in situ (DCIS) of left breast 2006   Family history of adverse reaction to anesthesia    mother has post op N/V   Hyperlipidemia    Hypertension associated with diabetes (HCC)    Hypothyroidism    Intraductal papilloma of breast, right 2018   Thyroid disease    Type 2 diabetes mellitus (HCC)     Past Surgical History:  Procedure Laterality Date   ABDOMINAL HYSTERECTOMY  04/09/2000   APPENDECTOMY  04/09/2000   BREAST BIOPSY Left 04/09/2004   BREAST BIOPSY Right    [place] Core Bx 10:00 Ribbon Clip- path pending   BREAST BIOPSY Right 12/19/2022   [place] Core Bx, 9:00 Coil Clip- path pending   BREAST BIOPSY Right 12/19/2022   [place] RT BREAST BX W LOC DEV 1ST LESION IMG BX SPEC US GUIDE 12/19/2022 [facility]   BREAST BIOPSY Right 12/19/2022   [place] RT BREAST BX W LOC DEV EA ADD LESION IMG BX SPEC US GUIDE 12/19/2022 [facility]   BREAST BIOPSY Right 01/09/2023   MM RT RADIO FREQUENCY TAG LOC MAMMO GUIDE 01/09/2023 [facility]   BREAST EXCISIONAL BIOPSY Right 2018   benign   BREAST LUMPECTOMY Left 2006   DCIS   BREAST LUMPECTOMY WITH RADIOFREQUENCY TAG IDENTIFICATION Right 01/29/2023   Procedure: BREAST LUMPECTOMY WITH RADIOFREQUENCY TAG IDENTIFICATION, SAVI scout tag;  Surgeon: Leafy Ro, MD;  Location: [facility];  Service: General;  Laterality: Right;   HARDWARE REMOVAL Left 1992   wrist plate and screws removed   INCISION AND DRAINAGE ABSCESS N/A 07/25/2015   Procedure: INCISION AND  DRAINAGE ABSCESS / GLUTEAL;  Surgeon: Ricarda Frame, MD;  Location: [facility];  Service: General;  Laterality: N/A;   WRIST FRACTURE SURGERY Left 1991   plate and screws    Family History  Problem Relation Age of Onset   Depression Mother    Colon polyps Mother    Heart attack Father    Hypertension Father    Diabetes Father    Depression Sister    Diabetes Maternal Grandmother    Cancer Maternal Grandmother    COPD Paternal Grandmother     Social History:  reports that she has never smoked. She has never been exposed to tobacco smoke. She has never used smokeless tobacco. She reports that she does not drink alcohol and does not use drugs.  Allergies:  Allergies  Allergen Reactions   Clindamycin Hcl Diarrhea    Other Reaction: GI trouble-hospitalized   Codeine Rash   Gabapentin Rash    Other Reaction: red skin  Other Reaction(s): flushing   Amoxicillin Itching    Other reaction(s): Other (See Comments) Other Reaction: blisters   Diphenhydramine Hcl Other (See Comments)    Other Reaction: racing heart    Medications reviewed.    ROS Full ROS performed and is otherwise negative other than what is stated in HPI   BP 111/70   Pulse 81   Temp  98 F (36.7 C)   Ht 5\' 4"  (1.626 m)   Wt 203 lb (92.1 kg)   SpO2 98%   BMI 34.84 kg/m   Physical Exam Chaperone present.  Vital signs stable afebrile. Right breast the majority of the incision is healing very well,  there is small opening ( 2mm) currently there is no evidence of purulence or infection.  Some serous fluid.  A/P persistent seroma.  I do not think that there is evidence to suggest infection. D/W her that there is not much to do for the seroma and that we just need time for the wound to heal. Instructed to use a more absorptive dressing. We will continue to follow her      Sterling Big, MD [facility] General Surgeon

## 2023-03-13 ENCOUNTER — Encounter: Payer: 59 | Admitting: Surgery

## 2023-03-18 ENCOUNTER — Ambulatory Visit: Payer: 59 | Admitting: Surgery

## 2023-03-18 ENCOUNTER — Encounter: Payer: Self-pay | Admitting: Surgery

## 2023-03-18 VITALS — BP 131/77 | HR 76 | Temp 98.2°F | Ht 64.0 in | Wt 204.0 lb

## 2023-03-18 DIAGNOSIS — Z09 Encounter for follow-up examination after completed treatment for conditions other than malignant neoplasm: Secondary | ICD-10-CM

## 2023-03-18 DIAGNOSIS — D0511 Intraductal carcinoma in situ of right breast: Secondary | ICD-10-CM

## 2023-03-18 NOTE — Patient Instructions (Addendum)
No further need for any dressings.  Follow-up with our office as needed.  Please call and ask to speak with a nurse if you develop questions or concerns.   We will see you back in September after your mammogram. We will send you a letter about these appointments.    Continue self breast exams. Call office for any new breast issues or concerns.

## 2023-03-18 NOTE — Progress Notes (Signed)
Virginia Martin is 7 weeks out following a right lumpectomy for benign disease. Pathology consistent with intraductal papilloma.  Her drainage has subsided  No fevers no chills. No other concerns.     Physical Exam Chaperone present.  Vital signs stable afebrile. Right breast the incision is healing very well,  No open wounds no hematomas or seroma   A/P Resolved seroma.  We will continue to follow her in  Sept 2025 after mammo

## 2023-04-13 ENCOUNTER — Other Ambulatory Visit: Payer: Self-pay | Admitting: Family Medicine

## 2023-04-16 NOTE — Telephone Encounter (Signed)
 Requested Prescriptions  Pending Prescriptions Disp Refills   rosuvastatin  (CRESTOR ) 5 MG tablet [Pharmacy Med Name: ROSUVASTATIN  CALCIUM  5 MG TAB] 90 tablet 1    Sig: TAKE 1 TABLET (5 MG TOTAL) BY MOUTH DAILY.     Cardiovascular:  Antilipid - Statins 2 Failed - 04/16/2023 12:04 PM      Failed - Lipid Panel in normal range within the last 12 months    Cholesterol, Total  Date Value Ref Range Status  11/02/2022 138 100 - 199 mg/dL Final   LDL Chol Calc (NIH)  Date Value Ref Range Status  11/02/2022 60 0 - 99 mg/dL Final   HDL  Date Value Ref Range Status  11/02/2022 51 >39 mg/dL Final   Triglycerides  Date Value Ref Range Status  11/02/2022 159 (H) 0 - 149 mg/dL Final         Passed - Cr in normal range and within 360 days    Creatinine, Ser  Date Value Ref Range Status  11/02/2022 0.95 0.57 - 1.00 mg/dL Final         Passed - Patient is not pregnant      Passed - Valid encounter within last 12 months    Recent Outpatient Visits           1 month ago Need for shingles vaccine   [facility] [place] Nancyann BRAVO, MD   4 months ago Dysuria   [place] [facility] [place], Jon HERO, MD   5 months ago Encounter for annual physical exam   [place] [facility] [place], Jon HERO, MD   12 months ago Hypertension associated with diabetes [facility])   [place] [facility] [place], Jon HERO, MD   1 year ago Type 2 diabetes mellitus with other specified complication, without long-term current use of insulin [facility])   [facility] Bacigal[place], Jon HERO, MD       Future Appointments             In 1 week Bacigalupo, Jon HERO, MD [facility]

## 2023-04-24 ENCOUNTER — Other Ambulatory Visit: Payer: Self-pay | Admitting: Family Medicine

## 2023-04-24 DIAGNOSIS — F419 Anxiety disorder, unspecified: Secondary | ICD-10-CM

## 2023-04-24 NOTE — Telephone Encounter (Signed)
 Requested Prescriptions  Pending Prescriptions Disp Refills   venlafaxine  XR (EFFEXOR -XR) 150 MG 24 hr capsule [Pharmacy Med Name: VENLAFAXINE  HCL ER 150 MG CAP] 90 capsule 0    Sig: TAKE 1 CAPSULE BY MOUTH EVERY DAY     Psychiatry: Antidepressants - SNRI - desvenlafaxine & venlafaxine  Failed - 04/24/2023  3:57 PM      Failed - Lipid Panel in normal range within the last 12 months    Cholesterol, Total  Date Value Ref Range Status  11/02/2022 138 100 - 199 mg/dL Final   LDL Chol Calc (NIH)  Date Value Ref Range Status  11/02/2022 60 0 - 99 mg/dL Final   HDL  Date Value Ref Range Status  11/02/2022 51 >39 mg/dL Final   Triglycerides  Date Value Ref Range Status  11/02/2022 159 (H) 0 - 149 mg/dL Final         Passed - Cr in normal range and within 360 days    Creatinine, Ser  Date Value Ref Range Status  11/02/2022 0.95 0.57 - 1.00 mg/dL Final         Passed - Completed PHQ-2 or PHQ-9 in the last 360 days      Passed - Last BP in normal range    BP Readings from Last 1 Encounters:  03/18/23 131/77         Passed - Valid encounter within last 6 months    Recent Outpatient Visits           1 month ago Need for shingles vaccine   [facility] Lamon Pillow, MD   4 months ago Dysuria   [facility] [place], Stan Eans, MD   5 months ago Encounter for annual physical exam   [place] [facility] [place], Stan Eans, MD   1 year ago Hypertension associated with diabetes [facility])   [place] [facility] [place], Stan Eans, MD   1 year ago Type 2 diabetes mellitus with other specified complication, without long-term current use of insulin [facility])   [place] [facility] Bacigal[place], Stan Eans, MD       Future Appointments             In 2 days Bacigalupo, Stan Eans, MD [facility]

## 2023-04-26 ENCOUNTER — Ambulatory Visit (INDEPENDENT_AMBULATORY_CARE_PROVIDER_SITE_OTHER): Payer: 59 | Admitting: Family Medicine

## 2023-04-26 ENCOUNTER — Encounter: Payer: Self-pay | Admitting: Family Medicine

## 2023-04-26 VITALS — BP 133/77 | HR 72 | Ht 64.0 in | Wt 206.5 lb

## 2023-04-26 DIAGNOSIS — Z1211 Encounter for screening for malignant neoplasm of colon: Secondary | ICD-10-CM

## 2023-04-26 DIAGNOSIS — Z7984 Long term (current) use of oral hypoglycemic drugs: Secondary | ICD-10-CM

## 2023-04-26 DIAGNOSIS — E1169 Type 2 diabetes mellitus with other specified complication: Secondary | ICD-10-CM | POA: Diagnosis not present

## 2023-04-26 DIAGNOSIS — Z6832 Body mass index (BMI) 32.0-32.9, adult: Secondary | ICD-10-CM

## 2023-04-26 DIAGNOSIS — E66811 Obesity, class 1: Secondary | ICD-10-CM

## 2023-04-26 DIAGNOSIS — E039 Hypothyroidism, unspecified: Secondary | ICD-10-CM | POA: Diagnosis not present

## 2023-04-26 DIAGNOSIS — F3342 Major depressive disorder, recurrent, in full remission: Secondary | ICD-10-CM

## 2023-04-26 DIAGNOSIS — E1159 Type 2 diabetes mellitus with other circulatory complications: Secondary | ICD-10-CM

## 2023-04-26 DIAGNOSIS — I10 Essential (primary) hypertension: Secondary | ICD-10-CM

## 2023-04-26 DIAGNOSIS — F419 Anxiety disorder, unspecified: Secondary | ICD-10-CM

## 2023-04-26 DIAGNOSIS — E785 Hyperlipidemia, unspecified: Secondary | ICD-10-CM

## 2023-04-26 DIAGNOSIS — I152 Hypertension secondary to endocrine disorders: Secondary | ICD-10-CM

## 2023-04-26 MED ORDER — OZEMPIC (1 MG/DOSE) 4 MG/3ML ~~LOC~~ SOPN: 1.0000 mg | PEN_INJECTOR | SUBCUTANEOUS | 2 refills | Status: DC

## 2023-04-26 MED ORDER — VENLAFAXINE HCL ER 75 MG PO CP24: 75.0000 mg | ORAL_CAPSULE | Freq: Every day | ORAL | 1 refills | Status: DC

## 2023-04-26 MED ORDER — VENLAFAXINE HCL ER 150 MG PO CP24: 150.0000 mg | ORAL_CAPSULE | Freq: Every day | ORAL | 1 refills | Status: DC

## 2023-04-26 MED ORDER — ROSUVASTATIN CALCIUM 5 MG PO TABS: 5.0000 mg | ORAL_TABLET | Freq: Every day | ORAL | 1 refills | Status: DC

## 2023-04-26 MED ORDER — LEVOTHYROXINE SODIUM 75 MCG PO TABS: 75.0000 ug | ORAL_TABLET | Freq: Every day | ORAL | 3 refills | Status: DC

## 2023-04-26 MED ORDER — AMLODIPINE BESYLATE 10 MG PO TABS: 10.0000 mg | ORAL_TABLET | Freq: Every day | ORAL | 3 refills | Status: DC

## 2023-04-26 NOTE — Assessment & Plan Note (Signed)
On Synthroid 75 mcg daily. Advised morning administration for better absorption and consistent timing for stable thyroid levels. - Continue Synthroid 75 mcg daily - Take Synthroid in the morning - check TSH

## 2023-04-26 NOTE — Assessment & Plan Note (Signed)
Discussed importance of healthy weight management Discussed diet and exercise  

## 2023-04-26 NOTE — Assessment & Plan Note (Signed)
Off Ozempic since October due to surgery. Plans to restart at 0.5 mg and titrate to 1 mg after a few weeks. Discussed monitoring blood glucose levels and potential side effects, including gastrointestinal symptoms and hypoglycemia. - Restart Ozempic at 0.5 mg - Titrate Ozempic to 1 mg after a 2 weeks - Order HbA1c test

## 2023-04-26 NOTE — Assessment & Plan Note (Signed)
Blood pressure well-controlled on current medication regimen. Discussed routine monitoring to prevent complications such as stroke and heart disease. - Continue Amlodipine 10 mg daily

## 2023-04-26 NOTE — Progress Notes (Signed)
Established patient visit   Patient: Virginia Martin   DOB: February 24, 1966   58 y.o. Female  MRN: [id number] Visit Date: 04/26/2023  Today's healthcare provider: Shirlee Latch, MD   Chief Complaint  Patient presents with   Medical Management of Chronic Issues    6 month follow up. Last addressed 11/02/22   Diabetes   Hyperlipidemia   Hypertension   Hypothyroidism    Symptoms:   Subjective    Diabetes  Hyperlipidemia  Hypertension   HPI     Medical Management of Chronic Issues    Additional comments: 6 month follow up. Last addressed 11/02/22        Diabetes   Compliance with treatment is excellent.  Eye exam is current.  Patient does not see a podiatrist.        Hyperlipidemia   Current therapy includes statins.  Compliance with treatment is excellent.  Lower extremity edema: Present.        Hypothyroidism    Additional comments: Symptoms:      Last edited by Acey Lav, CMA on 04/26/2023  8:15 AM.       Discussed the use of AI scribe software for clinical note transcription with the patient, who gave verbal consent to proceed.  History of Present Illness   The patient, with a history of hypertension, hypothyroidism, hyperlipidemia, and diabetes, presents for a routine follow-up visit. They report feeling well overall, with no new symptoms or concerns. They mention a peculiar sensation of feeling cold in their feet, but deny any tingling or numbness. The patient had stopped their Ozempic (a medication for diabetes) for a while due to a surgery but plans to restart it. They take all their medications at night, including Synthroid, which they are advised to take in the morning for better absorption.         Medications: Outpatient Medications Prior to Visit  Medication Sig   ibuprofen (ADVIL) 200 MG tablet Take 600 mg by mouth every 6 (six) hours as needed.   [DISCONTINUED] amLODipine (NORVASC) 10 MG tablet TAKE 1 TABLET BY MOUTH EVERY DAY  (Patient taking differently: Take 10 mg by mouth at bedtime.)   [DISCONTINUED] levothyroxine (SYNTHROID) 75 MCG tablet Take 1 tablet (75 mcg total) by mouth daily. (Patient taking differently: Take 75 mcg by mouth at bedtime.)   [DISCONTINUED] rosuvastatin (CRESTOR) 5 MG tablet TAKE 1 TABLET (5 MG TOTAL) BY MOUTH DAILY.   [DISCONTINUED] Semaglutide, 1 MG/DOSE, (OZEMPIC, 1 MG/DOSE,) 4 MG/3ML SOPN INJECT 1 MG ONCE A WEEK AS DIRECTED (Patient taking differently: Inject 1 mg into the skin once a week. Sunday)   [DISCONTINUED] venlafaxine XR (EFFEXOR-XR) 150 MG 24 hr capsule TAKE 1 CAPSULE BY MOUTH EVERY DAY   [DISCONTINUED] venlafaxine XR (EFFEXOR-XR) 75 MG 24 hr capsule TAKE 1 CAPSULE BY MOUTH DAILY WITH BREAKFAST. TAKE WITH THE 150 DOSE TO MAKE 225MG  DAILY. (Patient taking differently: Take 75 mg by mouth at bedtime.)   No facility-administered medications prior to visit.    Review of Systems     Objective    BP 133/77 (BP Location: Left Arm, Patient Position: Sitting, Cuff Size: Normal)   Pulse 72   Ht 5\' 4"  (1.626 m)   Wt 206 lb 8 oz (93.7 kg)   BMI 35.45 kg/m    Physical Exam Vitals reviewed.  Constitutional:      General: She is not in acute distress.    Appearance: Normal appearance. She is well-developed. She is not  diaphoretic.  HENT:     Head: Normocephalic and atraumatic.  Eyes:     General: No scleral icterus.    Conjunctiva/sclera: Conjunctivae normal.  Neck:     Thyroid: No thyromegaly.  Cardiovascular:     Rate and Rhythm: Normal rate and regular rhythm.     Heart sounds: Normal heart sounds. No murmur heard. Pulmonary:     Effort: Pulmonary effort is normal. No respiratory distress.     Breath sounds: Normal breath sounds. No wheezing, rhonchi or rales.  Musculoskeletal:     Cervical back: Neck supple.     Right lower leg: No edema.     Left lower leg: No edema.  Lymphadenopathy:     Cervical: No cervical adenopathy.  Skin:    General: Skin is warm and dry.   Neurological:     Mental Status: She is alert. Mental status is at baseline.      No results found for any visits on 04/26/23.  Assessment & Plan     Problem List Items Addressed This Visit       Cardiovascular and Mediastinum   Hypertension associated with diabetes (HCC)   Blood pressure well-controlled on current medication regimen. Discussed routine monitoring to prevent complications such as stroke and heart disease. - Continue Amlodipine 10 mg daily      Relevant Medications   amLODipine (NORVASC) 10 MG tablet   rosuvastatin (CRESTOR) 5 MG tablet   Semaglutide, 1 MG/DOSE, (OZEMPIC, 1 MG/DOSE,) 4 MG/3ML SOPN   Other Relevant Orders   Comprehensive metabolic panel     Endocrine   Hypothyroidism - Primary   On Synthroid 75 mcg daily. Advised morning administration for better absorption and consistent timing for stable thyroid levels. - Continue Synthroid 75 mcg daily - Take Synthroid in the morning - check TSH      Relevant Medications   levothyroxine (SYNTHROID) 75 MCG tablet   Other Relevant Orders   TSH   Hyperlipidemia associated with type 2 diabetes mellitus (HCC)   On Crestor 5 mg daily. Discussed lipid control to reduce cardiovascular risk. - Continue Crestor 5 mg daily - Order lipid panel test      Relevant Medications   amLODipine (NORVASC) 10 MG tablet   rosuvastatin (CRESTOR) 5 MG tablet   Semaglutide, 1 MG/DOSE, (OZEMPIC, 1 MG/DOSE,) 4 MG/3ML SOPN   Other Relevant Orders   Lipid Panel With LDL/HDL Ratio   Diabetes mellitus (HCC)   Off Ozempic since October due to surgery. Plans to restart at 0.5 mg and titrate to 1 mg after a few weeks. Discussed monitoring blood glucose levels and potential side effects, including gastrointestinal symptoms and hypoglycemia. - Restart Ozempic at 0.5 mg - Titrate Ozempic to 1 mg after a 2 weeks - Order HbA1c test      Relevant Medications   rosuvastatin (CRESTOR) 5 MG tablet   Semaglutide, 1 MG/DOSE, (OZEMPIC,  1 MG/DOSE,) 4 MG/3ML SOPN   Other Relevant Orders   Comprehensive metabolic panel   Hemoglobin A1c     Other   Depression   On Effexor 75 mg and 150 mg daily (total 225 mg). Discussed medication adherence and monitoring for side effects such as mood or behavior changes. - Continue Effexor 75 mg and 150 mg daily      Relevant Medications   venlafaxine XR (EFFEXOR-XR) 150 MG 24 hr capsule   venlafaxine XR (EFFEXOR-XR) 75 MG 24 hr capsule   Obesity   Discussed importance of healthy weight management Discussed diet  and exercise       Relevant Medications   Semaglutide, 1 MG/DOSE, (OZEMPIC, 1 MG/DOSE,) 4 MG/3ML SOPN   Other Relevant Orders   Comprehensive metabolic panel   Hemoglobin A1c   Other Visit Diagnoses       Primary hypertension       Relevant Medications   amLODipine (NORVASC) 10 MG tablet   rosuvastatin (CRESTOR) 5 MG tablet     Acute anxiety       Relevant Medications   venlafaxine XR (EFFEXOR-XR) 150 MG 24 hr capsule   venlafaxine XR (EFFEXOR-XR) 75 MG 24 hr capsule     Colon cancer screening       Relevant Orders   Cologuard           General Health Maintenance Declined pneumonia vaccination. Cologuard for colon cancer screening is due. Discussed regular screenings for early detection of colorectal cancer. - Order Cologuard for colon cancer screening - Declined pneumonia vaccination  Follow-up - Schedule physical in six months - Order labs for A1c, cholesterol, thyroid, kidney, and liver function - [employer] message with lab results when available.        Return in about 6 months (around 10/24/2023) for CPE.       Shirlee Latch, MD  [facility] [phone] (phone) [phone] (fax)  [facility]

## 2023-04-26 NOTE — Assessment & Plan Note (Signed)
On Crestor 5 mg daily. Discussed lipid control to reduce cardiovascular risk. - Continue Crestor 5 mg daily - Order lipid panel test

## 2023-04-26 NOTE — Assessment & Plan Note (Signed)
On Effexor 75 mg and 150 mg daily (total 225 mg). Discussed medication adherence and monitoring for side effects such as mood or behavior changes. - Continue Effexor 75 mg and 150 mg daily

## 2023-04-27 LAB — COMPREHENSIVE METABOLIC PANEL
ALT: 47 [IU]/L — ABNORMAL HIGH (ref 0–32)
AST: 31 [IU]/L (ref 0–40)
Albumin: 4.5 g/dL (ref 3.8–4.9)
Alkaline Phosphatase: 163 [IU]/L — ABNORMAL HIGH (ref 44–121)
BUN/Creatinine Ratio: 15 (ref 9–23)
BUN: 16 mg/dL (ref 6–24)
Bilirubin Total: 0.3 mg/dL (ref 0.0–1.2)
CO2: 24 mmol/L (ref 20–29)
Calcium: 9.5 mg/dL (ref 8.7–10.2)
Chloride: 100 mmol/L (ref 96–106)
Creatinine, Ser: 1.04 mg/dL — ABNORMAL HIGH (ref 0.57–1.00)
Globulin, Total: 3.1 g/dL (ref 1.5–4.5)
Glucose: 168 mg/dL — ABNORMAL HIGH (ref 70–99)
Potassium: 4.1 mmol/L (ref 3.5–5.2)
Sodium: 139 mmol/L (ref 134–144)
Total Protein: 7.6 g/dL (ref 6.0–8.5)
eGFR: 63 mL/min/{1.73_m2} (ref >59)

## 2023-04-27 LAB — LIPID PANEL WITH LDL/HDL RATIO
Cholesterol, Total: 143 mg/dL (ref 100–199)
HDL: 53 mg/dL (ref >39)
LDL Chol Calc (NIH): 61 mg/dL (ref 0–99)
LDL/HDL Ratio: 1.2 {ratio} (ref 0.0–3.2)
Triglycerides: 176 mg/dL — ABNORMAL HIGH (ref 0–149)
VLDL Cholesterol Cal: 29 mg/dL (ref 5–40)

## 2023-04-27 LAB — HEMOGLOBIN A1C
Est. average glucose Bld gHb Est-mCnc: 174 mg/dL
Hgb A1c MFr Bld: 7.7 % — ABNORMAL HIGH (ref 4.8–5.6)

## 2023-04-27 LAB — TSH: TSH: 5.57 u[IU]/mL — ABNORMAL HIGH (ref 0.450–4.500)

## 2023-04-29 ENCOUNTER — Encounter: Payer: Self-pay | Admitting: Family Medicine

## 2023-05-02 DIAGNOSIS — E039 Hypothyroidism, unspecified: Secondary | ICD-10-CM

## 2023-05-28 LAB — COLOGUARD: COLOGUARD: NEGATIVE

## 2023-06-18 ENCOUNTER — Telehealth: Payer: Self-pay

## 2023-06-18 ENCOUNTER — Other Ambulatory Visit (HOSPITAL_COMMUNITY): Payer: Self-pay

## 2023-06-18 NOTE — Telephone Encounter (Signed)
 error

## 2023-06-18 NOTE — Telephone Encounter (Signed)
 Pharmacy Patient Advocate Encounter   Received notification from Onbase that prior authorization for Ozempic (1 MG/DOSE) 4MG /3ML pen-injectors is required/requested.   Insurance verification completed.   The patient is insured through [facility] [employer] .   Per test claim: PA required; PA submitted to above mentioned insurance via CoverMyMeds Key/confirmation #/EOC [employer] Status is pending  *insurance has last name Coty

## 2023-06-18 NOTE — Telephone Encounter (Signed)
 Pharmacy Patient Advocate Encounter  Received notification from [facility] that Prior Authorization for Ozempic (1 MG/DOSE) 4MG /3ML pen-injectors has been APPROVED from 06/18/23 to 06/17/24. Ran test claim, Copay is $24.99. This test claim was processed through [facility]- copay amounts may vary at other pharmacies due to pharmacy/plan contracts, or as the patient moves through the different stages of their insurance plan.   PA #/Case ID/Reference #: Janora Norlander  *spoke with [facility] to process

## 2023-07-14 ENCOUNTER — Other Ambulatory Visit: Payer: Self-pay | Admitting: Family Medicine

## 2023-07-15 NOTE — Telephone Encounter (Signed)
 Requested medications are due for refill today.  yes  Requested medications are on the active medications list.  yes  Last refill. 04/26/2023 3mL 2 rf  Future visit scheduled.   no  Notes to clinic.  Please review for refill.    Requested Prescriptions  Pending Prescriptions Disp Refills   OZEMPIC, 1 MG/DOSE, 4 MG/3ML SOPN [Pharmacy Med Name: OZEMPIC 4 MG/3 ML (1 MG/DOSE)]  2    Sig: INJECT 1MG  INTO THE SKIN ONCE A WEEK     Endocrinology:  Diabetes - GLP-1 Receptor Agonists - semaglutide Failed - 07/15/2023  5:02 PM      Failed - HBA1C in normal range and within 180 days    Hgb A1c MFr Bld  Date Value Ref Range Status  04/26/2023 7.7 (H) 4.8 - 5.6 % Final    Comment:             Prediabetes: 5.7 - 6.4          Diabetes: >6.4          Glycemic control for adults with diabetes: <7.0          Failed - Cr in normal range and within 360 days    Creatinine, Ser  Date Value Ref Range Status  04/26/2023 1.04 (H) 0.57 - 1.00 mg/dL Final         Failed - Valid encounter within last 6 months    Recent Outpatient Visits   None

## 2023-07-26 ENCOUNTER — Ambulatory Visit: Payer: Self-pay

## 2023-07-26 NOTE — Telephone Encounter (Signed)
 Called and spoke to the pt per Dr R she has advised her to go to the ED, she verbally stated she was going to go but not tonight

## 2023-07-26 NOTE — Telephone Encounter (Signed)
 Copied from CRM [id number]. Topic: Clinical - Red Word Triage >> Jul 26, 2023 11:36 AM Lotus [place] B wrote: [facility] that prompted transfer to Nurse Triage: bad back pain   Chief Complaint: Back pain, seen in [facility], asking to be worked in. Symptoms: Pain Frequency: 1 week Pertinent Negatives: Patient denies  Disposition: [] ED /[] Urgent Care (no appt availability in office) / [] Appointment(In office/virtual)/ []  [facility]/ [] Home Care/ [] Refused Recommended Disposition /[] [facility]/ [x]  Follow-up with PCP Additional Notes: Please advise pt.  Reason for Disposition  [1] Pain radiates into the thigh or further down the leg AND [2] both legs  Answer Assessment - Initial Assessment Questions 1. ONSET: "When did the pain begin?"      Last Friday 2. LOCATION: "Where does it hurt?" (upper, mid or lower back)     Low 3. SEVERITY: "How bad is the pain?"  (e.g., Scale 1-10; mild, moderate, or severe)   - MILD (1-3): Doesn't interfere with normal activities.    - MODERATE (4-7): Interferes with normal activities or awakens from sleep.    - SEVERE (8-10): Excruciating pain, unable to do any normal activities.      10 4. PATTERN: "Is the pain constant?" (e.g., yes, no; constant, intermittent)      N/a 5. RADIATION: "Does the pain shoot into your legs or somewhere else?"     Down left leg 6. CAUSE:  "What do you think is causing the back pain?"      Pulled 7. BACK OVERUSE:  "Any recent lifting of heavy objects, strenuous work or exercise?"     Yes 8. MEDICINES: "What have you taken so far for the pain?" (e.g., nothing, acetaminophen , NSAIDS)     Prednisone  9. NEUROLOGIC SYMPTOMS: "Do you have any weakness, numbness, or problems with bowel/bladder control?"     No 10. OTHER SYMPTOMS: "Do you have any other symptoms?" (e.g., fever, abdomen pain, burning with urination, blood in urine)       no 11. PREGNANCY: "Is there any chance you are pregnant?" "When was your  last menstrual period?"       no  Protocols used: Back Pain-A-AH

## 2023-07-26 NOTE — Telephone Encounter (Signed)
Recommend ED visit.

## 2023-09-24 ENCOUNTER — Other Ambulatory Visit: Payer: Self-pay

## 2023-09-24 DIAGNOSIS — Z1231 Encounter for screening mammogram for malignant neoplasm of breast: Secondary | ICD-10-CM

## 2023-11-07 ENCOUNTER — Other Ambulatory Visit: Payer: Self-pay | Admitting: Family Medicine

## 2023-12-15 ENCOUNTER — Other Ambulatory Visit: Payer: Self-pay | Admitting: Family Medicine

## 2023-12-16 ENCOUNTER — Ambulatory Visit: Payer: Self-pay | Admitting: Surgery

## 2023-12-20 ENCOUNTER — Ambulatory Visit
Admission: RE | Admit: 2023-12-20 | Discharge: 2023-12-20 | Disposition: A | Discharge status: Home / Self Care | Payer: Self-pay | Source: Ambulatory Visit | Attending: Surgery | Admitting: Surgery

## 2023-12-20 DIAGNOSIS — Z1231 Encounter for screening mammogram for malignant neoplasm of breast: Secondary | ICD-10-CM | POA: Diagnosis present

## 2023-12-25 ENCOUNTER — Ambulatory Visit: Payer: Self-pay

## 2023-12-25 NOTE — Telephone Encounter (Signed)
 Message left for the patient notifying her of her results. She may call back to reschedule her follow up with Dr Jordis.

## 2023-12-25 NOTE — Telephone Encounter (Signed)
-----   Message from [place] sent at 12/24/2023  4:30 PM EDT ----- Please let her know mammo is good ----- Message ----- From: Interface, Rad Results In Sent: 12/24/2023   3:25 PM EDT To: Laneta JULIANNA Luna, MD

## 2024-01-08 ENCOUNTER — Other Ambulatory Visit: Payer: Self-pay | Admitting: Family Medicine

## 2024-01-08 DIAGNOSIS — F419 Anxiety disorder, unspecified: Secondary | ICD-10-CM

## 2024-01-09 ENCOUNTER — Telehealth: Payer: Self-pay | Admitting: Family Medicine

## 2024-01-09 NOTE — Telephone Encounter (Signed)
 Called patient to schedule CPE for medication refill. No answer, left VM to call back.

## 2024-01-09 NOTE — Telephone Encounter (Signed)
 Needs appointment

## 2024-02-19 ENCOUNTER — Other Ambulatory Visit: Payer: Self-pay | Admitting: Family Medicine

## 2024-02-19 DIAGNOSIS — F419 Anxiety disorder, unspecified: Secondary | ICD-10-CM

## 2024-02-19 NOTE — Telephone Encounter (Signed)
 Copied from CRM [id number]. Topic: Clinical - Medication Refill >> Feb 19, 2024  8:21 AM Charlet HERO wrote: Medication: venlafaxine  XR (EFFEXOR -XR) 150 MG 24 hr capsule  Has the patient contacted their pharmacy? Yes 0 refills   This is the patient's preferred pharmacy:  [id number] [place],  - [address] [place] [address] Phone: [phone] Fax: [phone]  Is this the correct pharmacy for this prescription? Yes If no, delete pharmacy and type the correct one.   Has the prescription been filled recently? Yes  Is the patient out of the medication? Yes  Has the patient been seen for an appointment in the last year OR does the patient have an upcoming appointment? Yes  Can we respond through MyChart? Yes  Agent: Please be advised that Rx refills may take up to 3 business days. We ask that you follow-up with your pharmacy.

## 2024-02-20 NOTE — Telephone Encounter (Signed)
 Requested Prescriptions  Refused Prescriptions Disp Refills   venlafaxine  XR (EFFEXOR -XR) 150 MG 24 hr capsule 90 capsule 1    Sig: Take 1 capsule (150 mg total) by mouth daily.     Psychiatry: Antidepressants - SNRI - desvenlafaxine & venlafaxine  Failed - 02/20/2024  5:26 PM      Failed - Cr in normal range and within 360 days    Creatinine, Ser  Date Value Ref Range Status  04/26/2023 1.04 (H) 0.57 - 1.00 mg/dL Final         Failed - Valid encounter within last 6 months    Recent Outpatient Visits   None            Failed - Lipid Panel in normal range within the last 12 months    Cholesterol, Total  Date Value Ref Range Status  04/26/2023 143 100 - 199 mg/dL Final   LDL Chol Calc (NIH)  Date Value Ref Range Status  04/26/2023 61 0 - 99 mg/dL Final   HDL  Date Value Ref Range Status  04/26/2023 53 >39 mg/dL Final   Triglycerides  Date Value Ref Range Status  04/26/2023 176 (H) 0 - 149 mg/dL Final         Passed - Completed PHQ-2 or PHQ-9 in the last 360 days      Passed - Last BP in normal range    BP Readings from Last 1 Encounters:  04/26/23 133/77

## 2024-03-06 ENCOUNTER — Other Ambulatory Visit: Payer: Self-pay | Admitting: Family Medicine

## 2024-03-14 ENCOUNTER — Other Ambulatory Visit: Payer: Self-pay | Admitting: Family Medicine

## 2024-03-14 DIAGNOSIS — F419 Anxiety disorder, unspecified: Secondary | ICD-10-CM

## 2024-03-16 ENCOUNTER — Ambulatory Visit: Admitting: Family Medicine

## 2024-03-16 ENCOUNTER — Encounter: Payer: Self-pay | Admitting: Family Medicine

## 2024-03-16 ENCOUNTER — Other Ambulatory Visit: Payer: Self-pay | Admitting: Family Medicine

## 2024-03-16 VITALS — BP 121/59 | HR 70 | Ht 64.0 in | Wt 195.5 lb

## 2024-03-16 DIAGNOSIS — E785 Hyperlipidemia, unspecified: Secondary | ICD-10-CM | POA: Diagnosis not present

## 2024-03-16 DIAGNOSIS — F410 Panic disorder [episodic paroxysmal anxiety] without agoraphobia: Secondary | ICD-10-CM | POA: Diagnosis not present

## 2024-03-16 DIAGNOSIS — Z6833 Body mass index (BMI) 33.0-33.9, adult: Secondary | ICD-10-CM

## 2024-03-16 DIAGNOSIS — I152 Hypertension secondary to endocrine disorders: Secondary | ICD-10-CM | POA: Diagnosis not present

## 2024-03-16 DIAGNOSIS — Z7985 Long-term (current) use of injectable non-insulin antidiabetic drugs: Secondary | ICD-10-CM | POA: Diagnosis not present

## 2024-03-16 DIAGNOSIS — E1159 Type 2 diabetes mellitus with other circulatory complications: Secondary | ICD-10-CM | POA: Diagnosis not present

## 2024-03-16 DIAGNOSIS — F3342 Major depressive disorder, recurrent, in full remission: Secondary | ICD-10-CM

## 2024-03-16 DIAGNOSIS — E1169 Type 2 diabetes mellitus with other specified complication: Secondary | ICD-10-CM

## 2024-03-16 DIAGNOSIS — E039 Hypothyroidism, unspecified: Secondary | ICD-10-CM | POA: Diagnosis not present

## 2024-03-16 DIAGNOSIS — E66811 Obesity, class 1: Secondary | ICD-10-CM

## 2024-03-16 DIAGNOSIS — Z7984 Long term (current) use of oral hypoglycemic drugs: Secondary | ICD-10-CM | POA: Diagnosis not present

## 2024-03-16 MED ORDER — ROSUVASTATIN CALCIUM 5 MG PO TABS: 5.0000 mg | ORAL_TABLET | Freq: Every day | ORAL | 3 refills | Status: AC

## 2024-03-16 MED ORDER — OZEMPIC (1 MG/DOSE) 4 MG/3ML ~~LOC~~ SOPN: 1.0000 mg | PEN_INJECTOR | SUBCUTANEOUS | 25 refills | Status: AC

## 2024-03-16 MED ORDER — VENLAFAXINE HCL ER 75 MG PO CP24: 75.0000 mg | ORAL_CAPSULE | Freq: Every day | ORAL | 1 refills | Status: AC

## 2024-03-16 MED ORDER — HYDROXYZINE PAMOATE 25 MG PO CAPS: 25.0000 mg | ORAL_CAPSULE | Freq: Three times a day (TID) | ORAL | 1 refills | Status: AC | PRN

## 2024-03-16 MED ORDER — LEVOTHYROXINE SODIUM 75 MCG PO TABS: 75.0000 ug | ORAL_TABLET | Freq: Every day | ORAL | 3 refills | Status: DC

## 2024-03-16 MED ORDER — AMLODIPINE BESYLATE 10 MG PO TABS: 10.0000 mg | ORAL_TABLET | Freq: Every day | ORAL | 3 refills | Status: AC

## 2024-03-16 MED ORDER — VENLAFAXINE HCL ER 150 MG PO CP24: 150.0000 mg | ORAL_CAPSULE | Freq: Every day | ORAL | 1 refills | Status: AC

## 2024-03-16 NOTE — Progress Notes (Signed)
 Established patient visit   Patient: Virginia Martin   DOB: 07/30/1965   58 y.o. Female  MRN: [id number] Visit Date: 03/16/2024  Today's healthcare provider: Jon Eva, MD   Chief Complaint  Patient presents with   Medication Refill   Hypertension   Hyperlipidemia   Hypothyroidism   Anxiety   Subjective     Discussed the use of AI scribe software for clinical note transcription with the patient, who gave verbal consent to proceed.  History of Present Illness   Virginia Martin is a 58 year old female with panic attacks who presents for chronic follow-up.  Her panic attacks remain difficult to control on Effexor  XR 225 mg daily. She has no medication for acute breakthrough symptoms and uses calming and grounding techniques. She has not tried hydroxyzine  before.  Four weeks ago she had extensive dental extractions with removal of all upper teeth and most lower teeth. She has ongoing mouth pain and difficulty eating and was given 15 pain pills post-procedure. She has required several follow-up visits for bone fragment removal, most recently last week.  She stopped Ozempic  around the time of recent surgeries, including breast surgery complicated by an abscess, and is unsure if it is safe to restart. She is currently taking amlodipine  10 mg daily, Synthroid  75 mcg daily, Crestor  5 mg daily, and Ozempic  1 mg weekly, though she has had reduced oral intake and often can eat only a small "kid's meal" portion at a time.          03/16/2024    9:32 AM 11/27/2022    9:41 AM 09/19/2021    8:14 AM 03/11/2020    8:10 AM  GAD 7 : Generalized Anxiety Score  Nervous, Anxious, on Edge 2 1 2 1   Control/stop worrying 1 1 2 1   Worry too much - different things 1 0 3 0  Trouble relaxing 1 1 1 1   Restless 2 1 1 2   Easily annoyed or irritable 1 1 1 1   Afraid - awful might happen 0 0 0 0  Total GAD 7 Score 8 5 10 6   Anxiety Difficulty Not difficult at all Somewhat difficult Not difficult at  all Not difficult at all        03/16/2024    9:32 AM 02/27/2023    8:07 AM 11/27/2022    9:41 AM 11/02/2022    8:55 AM 04/20/2022    8:59 AM  Depression screen PHQ 2/9  Decreased Interest 1 0 1 1 0  Down, Depressed, Hopeless 1 1 0 1 0  PHQ - 2 Score 2 1 1 2  0  Altered sleeping 2 3 2 2 1   Tired, decreased energy 1 2 0 2 1  Change in appetite 2 0 0 0 0  Feeling bad or failure about yourself  1 0 0 0 0  Trouble concentrating 1 1 0 1 0  Moving slowly or fidgety/restless 1 0 0 1 0  Suicidal thoughts 0 0 0 0 0  PHQ-9 Score 10 7  3  8  2    Difficult doing work/chores Not difficult at all Not difficult at all Not difficult at all Not difficult at all Not difficult at all     Data saved with a previous flowsheet row definition     Medications: Outpatient Medications Prior to Visit  Medication Sig   ibuprofen  (ADVIL ) 200 MG tablet Take 600 mg by mouth every 6 (six) hours as needed.   [  DISCONTINUED] amLODipine  (NORVASC ) 10 MG tablet Take 1 tablet (10 mg total) by mouth daily.   [DISCONTINUED] levothyroxine  (SYNTHROID ) 75 MCG tablet Take 1 tablet (75 mcg total) by mouth daily.   [DISCONTINUED] rosuvastatin  (CRESTOR ) 5 MG tablet TAKE 1 TABLET (5 MG TOTAL) BY MOUTH DAILY.   [DISCONTINUED] Semaglutide , 1 MG/DOSE, (OZEMPIC , 1 MG/DOSE,) 4 MG/3ML SOPN INJECT 1MG  INTO THE SKIN ONCE A WEEK   [DISCONTINUED] venlafaxine  XR (EFFEXOR -XR) 150 MG 24 hr capsule TAKE 1 CAPSULE BY MOUTH EVERY DAY   [DISCONTINUED] venlafaxine  XR (EFFEXOR -XR) 75 MG 24 hr capsule TAKE 1 CAPSULE BY MOUTH DAILY WITH BREAKFAST. TAKE WITH THE 150 DOSE TO MAKE 225MG  DAILY.   No facility-administered medications prior to visit.    Review of Systems     Objective    BP (!) 121/59 (BP Location: Left Arm, Patient Position: Sitting, Cuff Size: Large)   Pulse 70   Ht 5' 4 (1.626 m)   Wt 195 lb 8 oz (88.7 kg)   SpO2 97%   BMI 33.56 kg/m    Physical Exam Vitals reviewed.  Constitutional:      General: She is not in  acute distress.    Appearance: Normal appearance. She is well-developed. She is not diaphoretic.  HENT:     Head: Normocephalic and atraumatic.  Eyes:     General: No scleral icterus.    Conjunctiva/sclera: Conjunctivae normal.  Neck:     Thyroid : No thyromegaly.  Cardiovascular:     Rate and Rhythm: Normal rate and regular rhythm.     Heart sounds: Normal heart sounds. No murmur heard. Pulmonary:     Effort: Pulmonary effort is normal. No respiratory distress.     Breath sounds: Normal breath sounds. No wheezing, rhonchi or rales.  Musculoskeletal:     Cervical back: Neck supple.     Right lower leg: No edema.     Left lower leg: No edema.  Lymphadenopathy:     Cervical: No cervical adenopathy.  Skin:    General: Skin is warm and dry.     Findings: No rash.  Neurological:     Mental Status: She is alert and oriented to person, place, and time. Mental status is at baseline.  Psychiatric:        Mood and Affect: Mood normal.        Behavior: Behavior normal.     Diabetic Foot Exam - Simple   Simple Foot Form Diabetic Foot exam was performed with the following findings: Yes 03/16/2024  9:44 AM  Visual Inspection No deformities, no ulcerations, no other skin breakdown bilaterally: Yes Sensation Testing Intact to touch and monofilament testing bilaterally: Yes Pulse Check Posterior Tibialis and Dorsalis pulse intact bilaterally: Yes Comments      No results found for any visits on 03/16/24.  Assessment & Plan     Problem List Items Addressed This Visit       Cardiovascular and Mediastinum   Hypertension associated with diabetes (HCC) - Primary   Well controlled Continue current medications Recheck metabolic panel      Relevant Medications   amLODipine  (NORVASC ) 10 MG tablet   rosuvastatin  (CRESTOR ) 5 MG tablet   Semaglutide , 1 MG/DOSE, (OZEMPIC , 1 MG/DOSE,) 4 MG/3ML SOPN   Other Relevant Orders   Comprehensive metabolic panel with GFR     Endocrine    Hypothyroidism   Managed with Synthroid . - Continue Synthroid . - Ordered thyroid  function tests.      Relevant Medications   levothyroxine  (SYNTHROID ) 75  MCG tablet   Other Relevant Orders   TSH   Hyperlipidemia associated with type 2 diabetes mellitus (HCC)   Previously well controlled Continue statin Repeat FLP and CMP Goal LDL < 70       Relevant Medications   amLODipine  (NORVASC ) 10 MG tablet   rosuvastatin  (CRESTOR ) 5 MG tablet   Semaglutide , 1 MG/DOSE, (OZEMPIC , 1 MG/DOSE,) 4 MG/3ML SOPN   Other Relevant Orders   Comprehensive metabolic panel with GFR   Lipid panel   Diabetes mellitus (HCC)   Type 2 diabetes mellitus managed with Ozempic . Recent interruption in Ozempic  due to dental procedures. Hypertension managed with amlodipine . Hyperlipidemia managed with Crestor . Discussed resuming Ozempic  with a gradual increase in dosage if the break was longer than three weeks. Emphasized the importance of monitoring A1c levels and kidney and liver function. - Resume Ozempic  at 0.5 mg for two weeks, then increase to 1 mg. - Ordered lab tests including A1c, kidney and liver function, cholesterol, and thyroid  function. - Ordered urine test for proteinuria. - Performed foot exam.      Relevant Medications   rosuvastatin  (CRESTOR ) 5 MG tablet   Semaglutide , 1 MG/DOSE, (OZEMPIC , 1 MG/DOSE,) 4 MG/3ML SOPN   Other Relevant Orders   Hemoglobin A1c   Microalbumin / creatinine urine ratio     Other   Depression   Recurrent major depressive disorder in remission. Experiencing panic attacks due to recent stressors including dental procedures and personal loss. Currently on Effexor . Discussed the use of hydroxyzine  as needed for panic attacks, noting it is not habit-forming and does not interact with pain medications. Advised on grounding techniques for panic attacks. - Prescribed hydroxyzine  as needed for panic attacks. - Continue Effexor . - Advised on grounding techniques for panic  attacks.      Relevant Medications   venlafaxine  XR (EFFEXOR -XR) 150 MG 24 hr capsule   venlafaxine  XR (EFFEXOR -XR) 75 MG 24 hr capsule   hydrOXYzine  (VISTARIL ) 25 MG capsule   Panic attacks   Recurrent major depressive disorder in remission. Experiencing panic attacks due to recent stressors including dental procedures and personal loss. Currently on Effexor . Discussed the use of hydroxyzine  as needed for panic attacks, noting it is not habit-forming and does not interact with pain medications. Advised on grounding techniques for panic attacks. - Prescribed hydroxyzine  as needed for panic attacks. - Continue Effexor . - Advised on grounding techniques for panic attacks.      Relevant Medications   venlafaxine  XR (EFFEXOR -XR) 150 MG 24 hr capsule   venlafaxine  XR (EFFEXOR -XR) 75 MG 24 hr capsule   hydrOXYzine  (VISTARIL ) 25 MG capsule   Obesity   Obesity, class 1. Discussed the role of Ozempic  in weight management and the importance of resuming medication after a break due to dental procedures. - Resume Ozempic  as per diabetes management plan.      Relevant Medications   Semaglutide , 1 MG/DOSE, (OZEMPIC , 1 MG/DOSE,) 4 MG/3ML SOPN       General health maintenance Discussed the need for routine health maintenance including vaccinations and screenings. Cologuard screening is up to date. Discussed the option of pneumonia and shingles vaccinations - patient declines        Return in about 6 months (around 09/14/2024) for CPE.       Jon Eva, MD  [facility] [phone] (phone) [phone] (fax)  [facility]

## 2024-03-16 NOTE — Assessment & Plan Note (Signed)
 Managed with Synthroid . - Continue Synthroid . - Ordered thyroid  function tests.

## 2024-03-16 NOTE — Assessment & Plan Note (Signed)
 Well controlled Continue current medications Recheck metabolic panel

## 2024-03-16 NOTE — Assessment & Plan Note (Signed)
 Obesity, class 1. Discussed the role of Ozempic  in weight management and the importance of resuming medication after a break due to dental procedures. - Resume Ozempic  as per diabetes management plan.

## 2024-03-16 NOTE — Assessment & Plan Note (Signed)
 Type 2 diabetes mellitus managed with Ozempic . Recent interruption in Ozempic  due to dental procedures. Hypertension managed with amlodipine . Hyperlipidemia managed with Crestor . Discussed resuming Ozempic  with a gradual increase in dosage if the break was longer than three weeks. Emphasized the importance of monitoring A1c levels and kidney and liver function. - Resume Ozempic  at 0.5 mg for two weeks, then increase to 1 mg. - Ordered lab tests including A1c, kidney and liver function, cholesterol, and thyroid  function. - Ordered urine test for proteinuria. - Performed foot exam.

## 2024-03-16 NOTE — Assessment & Plan Note (Signed)
 Recurrent major depressive disorder in remission. Experiencing panic attacks due to recent stressors including dental procedures and personal loss. Currently on Effexor . Discussed the use of hydroxyzine  as needed for panic attacks, noting it is not habit-forming and does not interact with pain medications. Advised on grounding techniques for panic attacks. - Prescribed hydroxyzine  as needed for panic attacks. - Continue Effexor . - Advised on grounding techniques for panic attacks.

## 2024-03-16 NOTE — Assessment & Plan Note (Signed)
 Previously well controlled Continue statin Repeat FLP and CMP Goal LDL < 70

## 2024-03-17 ENCOUNTER — Ambulatory Visit: Payer: Self-pay | Admitting: Family Medicine

## 2024-03-17 DIAGNOSIS — E039 Hypothyroidism, unspecified: Secondary | ICD-10-CM

## 2024-03-17 DIAGNOSIS — Z9189 Other specified personal risk factors, not elsewhere classified: Secondary | ICD-10-CM

## 2024-03-17 LAB — HEMOGLOBIN A1C
Est. average glucose Bld gHb Est-mCnc: 154 mg/dL
Hgb A1c MFr Bld: 7 % — ABNORMAL HIGH (ref 4.8–5.6)

## 2024-03-17 LAB — COMPREHENSIVE METABOLIC PANEL WITH GFR
ALT: 28 IU/L (ref 0–32)
AST: 21 IU/L (ref 0–40)
Albumin: 4.7 g/dL (ref 3.8–4.9)
Alkaline Phosphatase: 147 IU/L — ABNORMAL HIGH (ref 49–135)
BUN/Creatinine Ratio: 21 (ref 9–23)
BUN: 21 mg/dL (ref 6–24)
Bilirubin Total: 0.4 mg/dL (ref 0.0–1.2)
CO2: 25 mmol/L (ref 20–29)
Calcium: 10.4 mg/dL — ABNORMAL HIGH (ref 8.7–10.2)
Chloride: 105 mmol/L (ref 96–106)
Creatinine, Ser: 0.98 mg/dL (ref 0.57–1.00)
Globulin, Total: 2.9 g/dL (ref 1.5–4.5)
Glucose: 149 mg/dL — ABNORMAL HIGH (ref 70–99)
Potassium: 4.4 mmol/L (ref 3.5–5.2)
Sodium: 145 mmol/L — ABNORMAL HIGH (ref 134–144)
Total Protein: 7.6 g/dL (ref 6.0–8.5)
eGFR: 67 mL/min/1.73 (ref >59)

## 2024-03-17 LAB — LIPID PANEL
Chol/HDL Ratio: 2.6 ratio (ref 0.0–4.4)
Cholesterol, Total: 160 mg/dL (ref 100–199)
HDL: 62 mg/dL (ref >39)
LDL Chol Calc (NIH): 67 mg/dL (ref 0–99)
Triglycerides: 190 mg/dL — ABNORMAL HIGH (ref 0–149)
VLDL Cholesterol Cal: 31 mg/dL (ref 5–40)

## 2024-03-17 LAB — TSH: TSH: 4.58 u[IU]/mL — ABNORMAL HIGH (ref 0.450–4.500)

## 2024-03-17 LAB — MICROALBUMIN / CREATININE URINE RATIO
Creatinine, Urine: 181 mg/dL
Microalb/Creat Ratio: 18 mg/g{creat} (ref 0–29)
Microalbumin, Urine: 32.3 ug/mL

## 2024-03-17 MED ORDER — LEVOTHYROXINE SODIUM 88 MCG PO TABS: 88.0000 ug | ORAL_TABLET | Freq: Every day | ORAL | 1 refills | Status: AC

## 2024-04-07 ENCOUNTER — Ambulatory Visit: Admitting: Family Medicine

## 2024-04-09 LAB — COMPREHENSIVE METABOLIC PANEL WITH GFR
ALT: 36 IU/L — ABNORMAL HIGH (ref 0–32)
AST: 23 IU/L (ref 0–40)
Albumin: 4.4 g/dL (ref 3.8–4.9)
Alkaline Phosphatase: 148 IU/L — ABNORMAL HIGH (ref 49–135)
BUN/Creatinine Ratio: 18 (ref 9–23)
BUN: 17 mg/dL (ref 6–24)
Bilirubin Total: 0.4 mg/dL (ref 0.0–1.2)
CO2: 24 mmol/L (ref 20–29)
Calcium: 9.8 mg/dL (ref 8.7–10.2)
Chloride: 101 mmol/L (ref 96–106)
Creatinine, Ser: 0.97 mg/dL (ref 0.57–1.00)
Globulin, Total: 3.3 g/dL (ref 1.5–4.5)
Glucose: 179 mg/dL — ABNORMAL HIGH (ref 70–99)
Potassium: 4.2 mmol/L (ref 3.5–5.2)
Sodium: 142 mmol/L (ref 134–144)
Total Protein: 7.7 g/dL (ref 6.0–8.5)
eGFR: 68 mL/min/1.73

## 2024-04-09 LAB — TSH: TSH: 4.34 u[IU]/mL (ref 0.450–4.500)
